# Patient Record
Sex: Female | Born: 1995 | Race: Black or African American | Hispanic: No | Marital: Single | Smoking: Never smoker
Health system: Southern US, Community
[De-identification: ages and names within clinical notes are randomized; demographics above are authoritative.]

---

## 2014-06-03 ENCOUNTER — Emergency Department (HOSPITAL_BASED_OUTPATIENT_CLINIC_OR_DEPARTMENT_OTHER): Payer: Medicaid Other

## 2014-06-03 ENCOUNTER — Emergency Department (HOSPITAL_BASED_OUTPATIENT_CLINIC_OR_DEPARTMENT_OTHER)
Admission: EM | Admit: 2014-06-03 | Discharge: 2014-06-04 | Disposition: A | Discharge status: Home / Self Care | Payer: Medicaid Other | Attending: Emergency Medicine | Admitting: Emergency Medicine

## 2014-06-03 ENCOUNTER — Encounter (HOSPITAL_BASED_OUTPATIENT_CLINIC_OR_DEPARTMENT_OTHER): Payer: Self-pay | Admitting: Emergency Medicine

## 2014-06-03 DIAGNOSIS — B349 Viral infection, unspecified: Secondary | ICD-10-CM

## 2014-06-03 DIAGNOSIS — R079 Chest pain, unspecified: Secondary | ICD-10-CM | POA: Insufficient documentation

## 2014-06-03 DIAGNOSIS — R05 Cough: Secondary | ICD-10-CM | POA: Diagnosis present

## 2014-06-03 NOTE — ED Notes (Signed)
Cough, chills, difficulty taking a deep breath x 2 days. Pain in her left lung. Hx of pneumonia.

## 2014-06-04 NOTE — Discharge Instructions (Signed)
We saw you in the ER for the chest discomfort and cough. We think what you have is a viral syndrome - the treatment for which is symptomatic relief only, and your body will fight the infection off in a few days.  See your primary care doctor in 1 week if the symptoms dont improve.   Viral Infections A viral infection can be caused by different types of viruses.Most viral infections are not serious and resolve on their own. However, some infections may cause severe symptoms and may lead to further complications. SYMPTOMS Viruses can frequently cause:  Minor sore throat.  Aches and pains.  Headaches.  Runny nose.  Different types of rashes.  Watery eyes.  Tiredness.  Cough.  Loss of appetite.  Gastrointestinal infections, resulting in nausea, vomiting, and diarrhea. These symptoms do not respond to antibiotics because the infection is not caused by bacteria. However, you might catch a bacterial infection following the viral infection. This is sometimes called a "superinfection." Symptoms of such a bacterial infection may include:  Worsening sore throat with pus and difficulty swallowing.  Swollen neck glands.  Chills and a high or persistent fever.  Severe headache.  Tenderness over the sinuses.  Persistent overall ill feeling (malaise), muscle aches, and tiredness (fatigue).  Persistent cough.  Yellow, green, or brown mucus production with coughing. HOME CARE INSTRUCTIONS   Only take over-the-counter or prescription medicines for pain, discomfort, diarrhea, or fever as directed by your caregiver.  Drink enough water and fluids to keep your urine clear or pale yellow. Sports drinks can provide valuable electrolytes, sugars, and hydration.  Get plenty of rest and maintain proper nutrition. Soups and broths with crackers or rice are fine. SEEK IMMEDIATE MEDICAL CARE IF:   You have severe headaches, shortness of breath, chest pain, neck pain, or an unusual  rash.  You have uncontrolled vomiting, diarrhea, or you are unable to keep down fluids.  You or your child has an oral temperature above 102 F (38.9 C), not controlled by medicine.  Your baby is older than 3 months with a rectal temperature of 102 F (38.9 C) or higher.  Your baby is 313 months old or younger with a rectal temperature of 100.4 F (38 C) or higher. MAKE SURE YOU:   Understand these instructions.  Will watch your condition.  Will get help right away if you are not doing well or get worse. Document Released: 05/22/2005 Document Revised: 11/04/2011 Document Reviewed: 12/17/2010 [facility] IExitCare Patient Information 2015 [place], [place]. This information is not intended to replace advice given to you by your health care provider. Make sure you discuss any questions you have with your health care provider.
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
## 2014-11-25 ENCOUNTER — Encounter (HOSPITAL_BASED_OUTPATIENT_CLINIC_OR_DEPARTMENT_OTHER): Payer: Self-pay

## 2014-11-25 ENCOUNTER — Emergency Department (HOSPITAL_BASED_OUTPATIENT_CLINIC_OR_DEPARTMENT_OTHER)
Admission: EM | Admit: 2014-11-25 | Discharge: 2014-11-25 | Disposition: A | Discharge status: Home / Self Care | Payer: Medicaid Other | Attending: Emergency Medicine | Admitting: Emergency Medicine

## 2014-11-25 DIAGNOSIS — L509 Urticaria, unspecified: Secondary | ICD-10-CM | POA: Diagnosis not present

## 2014-11-25 DIAGNOSIS — R21 Rash and other nonspecific skin eruption: Secondary | ICD-10-CM | POA: Diagnosis present

## 2014-11-25 DIAGNOSIS — R110 Nausea: Secondary | ICD-10-CM | POA: Diagnosis not present

## 2014-11-25 MED ORDER — PREDNISONE 50 MG PO TABS
60.0000 mg | ORAL_TABLET | Freq: Once | ORAL | Status: AC
  Administered 2014-11-25: 60 mg via ORAL
  Filled 2014-11-25 (×2): qty 1

## 2014-11-25 MED ORDER — PREDNISONE 10 MG PO TABS
10.0000 mg | ORAL_TABLET | Freq: Every day | ORAL | Status: DC
  Filled 2014-11-25: qty 1

## 2014-11-25 MED ORDER — FAMOTIDINE 20 MG PO TABS
20.0000 mg | ORAL_TABLET | Freq: Once | ORAL | Status: AC
  Administered 2014-11-25: 20 mg via ORAL
  Filled 2014-11-25: qty 1

## 2014-11-25 MED ORDER — DIPHENHYDRAMINE HCL 25 MG PO TABS: 25.0000 mg | ORAL_TABLET | Freq: Four times a day (QID) | ORAL | Status: DC

## 2014-11-25 MED ORDER — PREDNISONE 20 MG PO TABS: ORAL_TABLET | ORAL | Status: DC

## 2014-11-25 MED ORDER — FAMOTIDINE 20 MG PO TABS: 20.0000 mg | ORAL_TABLET | Freq: Two times a day (BID) | ORAL | Status: DC

## 2014-11-25 MED ORDER — DIPHENHYDRAMINE HCL 25 MG PO CAPS
25.0000 mg | ORAL_CAPSULE | Freq: Once | ORAL | Status: AC
  Administered 2014-11-25: 25 mg via ORAL

## 2014-11-25 MED ORDER — DIPHENHYDRAMINE HCL 25 MG PO CAPS
25.0000 mg | ORAL_CAPSULE | Freq: Four times a day (QID) | ORAL | Status: DC | PRN
  Filled 2014-11-25: qty 1
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
## 2014-11-25 NOTE — ED Notes (Signed)
Pt states stayed at her grandmothers last night; states has been itching all over since; pt has raised areas to bilateral arms, legs, ankles and abdomen; denies any new or change in lotions or soaps. No distress noted

## 2014-11-25 NOTE — Discharge Instructions (Signed)
Hives Hives are itchy, red, swollen areas of the skin. They can vary in size and location on your body. Hives can come and go for hours or several days (acute hives) or for several weeks (chronic hives). Hives do not spread from person to person (noncontagious). They may get worse with scratching, exercise, and emotional stress. CAUSES   Allergic reaction to food, additives, or drugs.  Infections, including the common cold.  Illness, such as vasculitis, lupus, or thyroid disease.  Exposure to sunlight, heat, or cold.  Exercise.  Stress.  Contact with chemicals. SYMPTOMS   Red or white swollen patches on the skin. The patches may change size, shape, and location quickly and repeatedly.  Itching.  Swelling of the hands, feet, and face. This may occur if hives develop deeper in the skin. DIAGNOSIS  Your caregiver can usually tell what is wrong by performing a physical exam. Skin or blood tests may also be done to determine the cause of your hives. In some cases, the cause cannot be determined. TREATMENT  Mild cases usually get better with medicines such as antihistamines. Severe cases may require an emergency epinephrine injection. If the cause of your hives is known, treatment includes avoiding that trigger.  HOME CARE INSTRUCTIONS   Avoid causes that trigger your hives.  Take antihistamines as directed by your caregiver to reduce the severity of your hives. Non-sedating or low-sedating antihistamines are usually recommended. Do not drive while taking an antihistamine.  Take any other medicines prescribed for itching as directed by your caregiver.  Wear loose-fitting clothing.  Keep all follow-up appointments as directed by your caregiver. SEEK MEDICAL CARE IF:   You have persistent or severe itching that is not relieved with medicine.  You have painful or swollen joints. SEEK IMMEDIATE MEDICAL CARE IF:   You have a fever.  Your tongue or lips are swollen.  You have  trouble breathing or swallowing.  You feel tightness in the throat or chest.  You have abdominal pain. These problems may be the first sign of a life-threatening allergic reaction. Call your local emergency services (911 in [place] MAKE SURE YOU:   Understand these instructions.  Will watch your condition.  Will get help right away if you are not doing well or get worse. Document Released: 08/12/2005 Document Revised: 08/17/2013 Document Reviewed: 11/05/2011 [employer] Patient Information 2015 [employer]. This information is not intended to replace advice given to you by your health care provider. Make sure you discuss any questions you have with your health care provider.  

## 2015-01-05 ENCOUNTER — Emergency Department (HOSPITAL_BASED_OUTPATIENT_CLINIC_OR_DEPARTMENT_OTHER)
Admission: EM | Admit: 2015-01-05 | Discharge: 2015-01-05 | Disposition: A | Discharge status: Home / Self Care | Payer: No Typology Code available for payment source | Attending: Emergency Medicine | Admitting: Emergency Medicine

## 2015-01-05 ENCOUNTER — Encounter (HOSPITAL_BASED_OUTPATIENT_CLINIC_OR_DEPARTMENT_OTHER): Payer: Self-pay | Admitting: *Deleted

## 2015-01-05 ENCOUNTER — Emergency Department (HOSPITAL_BASED_OUTPATIENT_CLINIC_OR_DEPARTMENT_OTHER): Payer: No Typology Code available for payment source

## 2015-01-05 DIAGNOSIS — Y9389 Activity, other specified: Secondary | ICD-10-CM | POA: Insufficient documentation

## 2015-01-05 DIAGNOSIS — S3992XA Unspecified injury of lower back, initial encounter: Secondary | ICD-10-CM | POA: Diagnosis not present

## 2015-01-05 DIAGNOSIS — Y998 Other external cause status: Secondary | ICD-10-CM | POA: Diagnosis not present

## 2015-01-05 DIAGNOSIS — S199XXA Unspecified injury of neck, initial encounter: Secondary | ICD-10-CM | POA: Insufficient documentation

## 2015-01-05 DIAGNOSIS — Y92410 Unspecified street and highway as the place of occurrence of the external cause: Secondary | ICD-10-CM | POA: Insufficient documentation

## 2015-01-05 DIAGNOSIS — S4992XA Unspecified injury of left shoulder and upper arm, initial encounter: Secondary | ICD-10-CM | POA: Insufficient documentation

## 2015-01-05 DIAGNOSIS — M25512 Pain in left shoulder: Secondary | ICD-10-CM
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
## 2015-01-29 ENCOUNTER — Emergency Department (HOSPITAL_BASED_OUTPATIENT_CLINIC_OR_DEPARTMENT_OTHER)
Admission: EM | Admit: 2015-01-29 | Discharge: 2015-01-29 | Disposition: A | Discharge status: Home / Self Care | Payer: Medicaid Other | Attending: Emergency Medicine | Admitting: Emergency Medicine

## 2015-01-29 ENCOUNTER — Encounter (HOSPITAL_BASED_OUTPATIENT_CLINIC_OR_DEPARTMENT_OTHER): Payer: Self-pay | Admitting: *Deleted

## 2015-01-29 DIAGNOSIS — Z793 Long term (current) use of hormonal contraceptives: Secondary | ICD-10-CM | POA: Insufficient documentation

## 2015-01-29 DIAGNOSIS — R350 Frequency of micturition: Secondary | ICD-10-CM | POA: Diagnosis present

## 2015-01-29 DIAGNOSIS — Z3202 Encounter for pregnancy test, result negative: Secondary | ICD-10-CM | POA: Diagnosis not present

## 2015-01-29 DIAGNOSIS — N390 Urinary tract infection, site not specified: Secondary | ICD-10-CM | POA: Insufficient documentation

## 2015-01-29 LAB — URINALYSIS, ROUTINE W REFLEX MICROSCOPIC
BILIRUBIN URINE: NEGATIVE
GLUCOSE, UA: NEGATIVE mg/dL
Ketones, ur: NEGATIVE mg/dL
NITRITE: NEGATIVE
Protein, ur: 100 mg/dL — AB
SPECIFIC GRAVITY, URINE: 1.023 (ref 1.005–1.030)
Urobilinogen, UA: 1 mg/dL (ref 0.0–1.0)
pH: 6 (ref 5.0–8.0)

## 2015-01-29 LAB — PREGNANCY, URINE: Preg Test, Ur: NEGATIVE

## 2015-01-29 LAB — URINE MICROSCOPIC-ADD ON

## 2015-01-29 MED ORDER — CEPHALEXIN 250 MG PO CAPS
500.0000 mg | ORAL_CAPSULE | Freq: Once | ORAL | Status: AC
  Administered 2015-01-29: 500 mg via ORAL
  Filled 2015-01-29: qty 2

## 2015-01-29 MED ORDER — CEPHALEXIN 500 MG PO CAPS: 500.0000 mg | ORAL_CAPSULE | Freq: Two times a day (BID) | ORAL | Status: DC

## 2015-01-29 NOTE — ED Provider Notes (Signed)
CSN: [id number]     Arrival date & time 01/29/15  1326 History   First MD Initiated Contact with Patient 01/29/15 1328     Chief Complaint  Patient presents with  . Urinary Frequency     (Consider location/radiation/quality/duration/timing/severity/associated sxs/prior Treatment) HPI   Katie Wheeler is a 19 y.o. female complaining of urinary frequency with lower abdominal pressure and nausea x1 day. Patient states that she is not having any abnormal vaginal discharge, states that she's spotting but that's typical for her Seasonique birth control pills. She denies flank pain, fever, vomiting, hematuria.  History reviewed. No pertinent past medical history. History reviewed. No pertinent past surgical history. History reviewed. No pertinent family history. History  Substance Use Topics  . Smoking status: Never Smoker   . Smokeless tobacco: Not on file  . Alcohol Use: No   OB History    No data available     Review of Systems  10 systems reviewed and found to be negative, except as noted in the HPI.   Allergies  Review of patient's allergies indicates no known allergies.  Home Medications   Prior to Admission medications   Medication Sig Start Date End Date Taking? Authorizing Provider  levonorgestrel-ethinyl estradiol (SEASONALE,INTROVALE,JOLESSA) 0.15-0.03 MG tablet Take 1 tablet by mouth daily.   Yes Historical Provider, MD  cephALEXin (KEFLEX) 500 MG capsule Take 1 capsule (500 mg total) by mouth 2 (two) times daily. 01/29/15   Madine Sarr, PA-C   BP 125/77 mmHg  Pulse 87  Temp(Src) 98.7 F (37.1 C)  Resp 16  SpO2 100%  LMP 01/28/2015 Physical Exam  Constitutional: She is oriented to person, place, and time. She appears well-developed and well-nourished. No distress.  HENT:  Head: Normocephalic and atraumatic.  Mouth/Throat: Oropharynx is clear and moist.  Eyes: Conjunctivae and EOM are normal. Pupils are equal, round, and reactive to light.  Neck: Normal  range of motion.  Cardiovascular: Normal rate, regular rhythm and intact distal pulses.   Pulmonary/Chest: Effort normal and breath sounds normal. No stridor.  Abdominal: Soft. There is no tenderness.  Genitourinary:  No CVAT b/l  Musculoskeletal: Normal range of motion.  Neurological: She is alert and oriented to person, place, and time.  Skin: She is not diaphoretic.  Psychiatric: She has a normal mood and affect.  Nursing note and vitals reviewed.   ED Course  Procedures (including critical care time) Labs Review Labs Reviewed  URINALYSIS, ROUTINE W REFLEX MICROSCOPIC (NOT AT [facility]) - Abnormal; Notable for the following:    Color, Urine AMBER (*)    APPearance TURBID (*)    Hgb urine dipstick LARGE (*)    Protein, ur 100 (*)    Leukocytes, UA LARGE (*)    All other components within normal limits  URINE MICROSCOPIC-ADD ON - Abnormal; Notable for the following:    Bacteria, UA MANY (*)    All other components within normal limits  URINE CULTURE  PREGNANCY, URINE    Imaging Review No results found.   EKG Interpretation None      MDM   Final diagnoses:  UTI (lower urinary tract infection)    Filed Vitals:   01/29/15 1336  BP: 125/77  Pulse: 87  Temp: 98.7 F (37.1 C)  Resp: 16  SpO2: 100%    Katie Wheeler is a pleasant 19 y.o. female presenting with urinary frequency and lower abdominal pressure associated with nausea worsening over the course of the day. Patient is afebrile, well-appearing, abdominal exam is benign.  Patient reports no abnormal vaginal discharge. Will check UA/urine pregnancy.  UA consistent with infection. Started empirically on flex. Urine cultures ordered. Return precautions for pyelonephritis discussed.  Evaluation does not show pathology that would require ongoing emergent intervention or inpatient treatment. Pt is hemodynamically stable and mentating appropriately. Discussed findings and plan with patient/guardian, who agrees with care  plan. All questions answered. Return precautions discussed and outpatient follow up given.   New Prescriptions   CEPHALEXIN (KEFLEX) 500 MG CAPSULE    Take 1 capsule (500 mg total) by mouth 2 (two) times daily.         Wynetta Emeryicole Julizza Sassone, PA-C 01/29/15 1421  Elwin MochaBlair Walden, MD 01/29/15 [phone]

## 2015-01-29 NOTE — Discharge Instructions (Signed)
°Take your antibiotics as directed and to completion. You should never have any leftover antibiotics! Push fluids and stay well hydrated.  ° °Any antibiotic use can reduce the efficacy of hormonal birth control. Please use back up method of contraception.  ° °Please follow with your primary care doctor in the next 2 days for a check-up. They must obtain records for further management.  ° °Do not hesitate to return to the Emergency Department for any new, worsening or concerning symptoms.  ° °. °Urinary Tract Infection °Urinary tract infections (UTIs) can develop anywhere along your urinary tract. Your urinary tract is your body's drainage system for removing wastes and extra water. Your urinary tract includes two kidneys, two ureters, a bladder, and a urethra. Your kidneys are a pair of bean-shaped organs. Each kidney is about the size of your fist. They are located below your ribs, one on each side of your spine. °CAUSES °Infections are caused by microbes, which are microscopic organisms, including fungi, viruses, and bacteria. These organisms are so small that they can only be seen through a microscope. Bacteria are the microbes that most commonly cause UTIs. °SYMPTOMS  °Symptoms of UTIs may vary by age and gender of the patient and by the location of the infection. Symptoms in young women typically include a frequent and intense urge to urinate and a painful, burning feeling in the bladder or urethra during urination. Older women and men are more likely to be tired, shaky, and weak and have muscle aches and abdominal pain. A fever may mean the infection is in your kidneys. Other symptoms of a kidney infection include pain in your back or sides below the ribs, nausea, and vomiting. °DIAGNOSIS °To diagnose a UTI, your caregiver will ask you about your symptoms. Your caregiver also will ask to provide a urine sample. The urine sample will be tested for bacteria and white blood cells. White blood cells are made by  your body to help fight infection. °TREATMENT  °Typically, UTIs can be treated with medication. Because most UTIs are caused by a bacterial infection, they usually can be treated with the use of antibiotics. The choice of antibiotic and length of treatment depend on your symptoms and the type of bacteria causing your infection. °HOME CARE INSTRUCTIONS °· If you were prescribed antibiotics, take them exactly as your caregiver instructs you. Finish the medication even if you feel better after you have only taken some of the medication. °· Drink enough water and fluids to keep your urine clear or pale yellow. °· Avoid caffeine, tea, and carbonated beverages. They tend to irritate your bladder. °· Empty your bladder often. Avoid holding urine for long periods of time. °· Empty your bladder before and after sexual intercourse. °· After a bowel movement, women should cleanse from front to back. Use each tissue only once. °SEEK MEDICAL CARE IF:  °· You have back pain. °· You develop a fever. °· Your symptoms do not begin to resolve within 3 days. °SEEK IMMEDIATE MEDICAL CARE IF:  °· You have severe back pain or lower abdominal pain. °· You develop chills. °· You have nausea or vomiting. °· You have continued burning or discomfort with urination. °MAKE SURE YOU:  °· Understand these instructions. °· Will watch your condition. °· Will get help right away if you are not doing well or get worse. °Document Released: 05/22/2005 Document Revised: 02/11/2012 Document Reviewed: 09/20/2011 °ExitCare® Patient Information ©2015 [employer]. This information is not intended to replace advice given to   you by your health care provider. Make sure you discuss any questions you have with your health care provider. ° °

## 2015-01-29 NOTE — ED Notes (Signed)
Pt c/o urinary freq with some  Pressure x 1 day, nausea " for weeks"

## 2015-01-31 LAB — URINE CULTURE

## 2015-02-01 ENCOUNTER — Telehealth (HOSPITAL_BASED_OUTPATIENT_CLINIC_OR_DEPARTMENT_OTHER): Payer: Self-pay | Admitting: Emergency Medicine

## 2015-02-01 NOTE — Telephone Encounter (Signed)
Post ED Visit - Positive Culture Follow-up  Culture report reviewed by antimicrobial stewardship pharmacist: []  Wes Dulaney, Pharm.D., BCPS []  Celedonio MiyamotoJeremy Frens, Pharm.D., BCPS [x]  Georgina PillionElizabeth Martin, Pharm.D., BCPS []  MulberryMinh Pham, [address]Ph[address]D., BCPS, AAHIVP []  Estella HuskMichelle Turner, Pharm.D., BCPS, AAHIVP []  Elder CyphersLorie Poole, [address]Pharm[address]D., BCPS  Positive urine  Culture Klebsiella Treated with cephalexin, organism sensitive to the same and no further patient follow-up is required at this time.  Berle MullMiller, Alisea Matte 02/01/2015, 9:27 AM

## 2015-04-02 ENCOUNTER — Encounter (HOSPITAL_BASED_OUTPATIENT_CLINIC_OR_DEPARTMENT_OTHER): Payer: Self-pay

## 2015-04-02 ENCOUNTER — Emergency Department (HOSPITAL_BASED_OUTPATIENT_CLINIC_OR_DEPARTMENT_OTHER)
Admission: EM | Admit: 2015-04-02 | Discharge: 2015-04-02 | Disposition: A | Discharge status: Home / Self Care | Payer: Medicaid Other | Attending: Emergency Medicine | Admitting: Emergency Medicine

## 2015-04-02 DIAGNOSIS — R05 Cough: Secondary | ICD-10-CM | POA: Diagnosis not present

## 2015-04-02 DIAGNOSIS — Z79899 Other long term (current) drug therapy: Secondary | ICD-10-CM | POA: Diagnosis not present

## 2015-04-02 DIAGNOSIS — R059 Cough, unspecified: Secondary | ICD-10-CM

## 2015-04-02 DIAGNOSIS — Z792 Long term (current) use of antibiotics: Secondary | ICD-10-CM | POA: Diagnosis not present

## 2015-04-02 DIAGNOSIS — R079 Chest pain, unspecified: Secondary | ICD-10-CM | POA: Insufficient documentation

## 2015-04-02 NOTE — Discharge Instructions (Signed)
Over the counter decongestants and cough medication as needed.  Return to the ER if symptoms significantly worsen or change.   Cough, Adult  A cough is a reflex that helps clear your throat and airways. It can help heal the body or may be a reaction to an irritated airway. A cough may only last 2 or 3 weeks (acute) or may last more than 8 weeks (chronic).  CAUSES Acute cough:  Viral or bacterial infections. Chronic cough:  Infections.  Allergies.  Asthma.  Post-nasal drip.  Smoking.  Heartburn or acid reflux.  Some medicines.  Chronic lung problems (COPD).  Cancer. SYMPTOMS   Cough.  Fever.  Chest pain.  Increased breathing rate.  High-pitched whistling sound when breathing (wheezing).  Colored mucus that you cough up (sputum). TREATMENT   A bacterial cough may be treated with antibiotic medicine.  A viral cough must run its course and will not respond to antibiotics.  Your caregiver may recommend other treatments if you have a chronic cough. HOME CARE INSTRUCTIONS   Only take over-the-counter or prescription medicines for pain, discomfort, or fever as directed by your caregiver. Use cough suppressants only as directed by your caregiver.  Use a cold steam vaporizer or humidifier in your bedroom or home to help loosen secretions.  Sleep in a semi-upright position if your cough is worse at night.  Rest as needed.  Stop smoking if you smoke. SEEK IMMEDIATE MEDICAL CARE IF:   You have pus in your sputum.  Your cough starts to worsen.  You cannot control your cough with suppressants and are losing sleep.  You begin coughing up blood.  You have difficulty breathing.  You develop pain which is getting worse or is uncontrolled with medicine.  You have a fever. MAKE SURE YOU:   Understand these instructions.  Will watch your condition.  Will get help right away if you are not doing well or get worse. Document Released: 02/08/2011 Document  Revised: 11/04/2011 Document Reviewed: 02/08/2011 [facility] Patient Information 2015 [place], [place]. This information is not intended to replace advice given to you by your health care provider. Make sure you discuss any questions you have with your health care provider.

## 2015-04-02 NOTE — ED Notes (Signed)
PT reports cough x2 days - states productive with yellow sputum - denies fever.

## 2015-04-02 NOTE — ED Provider Notes (Signed)
CSN: [id number]     Arrival date & time 04/02/15  0155 History   First MD Initiated Contact with Patient 04/02/15 0210     Chief Complaint  Patient presents with  . Cough     (Consider location/radiation/quality/duration/timing/severity/associated sxs/prior Treatment) HPI Comments: Patient is a 19 year old female with no significant past medical history. She started yesterday with chest congestion and cough. This evening she woke from sleep with chest congestion and coughed up a small amount of green sputum. She denies any chest pain, fevers, shortness of breath.  Patient is a 19 y.o. female presenting with cough. The history is provided by the patient.  Cough Cough characteristics:  Productive Sputum characteristics:  Green Severity:  Mild Onset quality:  Sudden Duration:  24 hours Timing:  Constant Progression:  Unchanged Chronicity:  New Relieved by:  Nothing Worsened by:  Nothing tried   History reviewed. No pertinent past medical history. History reviewed. No pertinent past surgical history. History reviewed. No pertinent family history. History  Substance Use Topics  . Smoking status: Never Smoker   . Smokeless tobacco: Not on file  . Alcohol Use: No   OB History    No data available     Review of Systems  Respiratory: Positive for cough.   All other systems reviewed and are negative.     Allergies  Review of patient's allergies indicates no known allergies.  Home Medications   Prior to Admission medications   Medication Sig Start Date End Date Taking? Authorizing Provider  cephALEXin (KEFLEX) 500 MG capsule Take 1 capsule (500 mg total) by mouth 2 (two) times daily. 01/29/15   Nicole Pisciotta, PA-C  levonorgestrel-ethinyl estradiol (SEASONALE,INTROVALE,JOLESSA) 0.15-0.03 MG tablet Take 1 tablet by mouth daily.    Historical Provider, MD   BP 142/78 mmHg  Pulse 88  Temp(Src) 99 F (37.2 C) (Oral)  SpO2 100%  LMP 02/27/2015 Physical Exam   Constitutional: She is oriented to person, place, and time. She appears well-developed and well-nourished. No distress.  HENT:  Head: Normocephalic and atraumatic.  Neck: Normal range of motion. Neck supple.  Cardiovascular: Normal rate and regular rhythm.  Exam reveals no gallop and no friction rub.   No murmur heard. Pulmonary/Chest: Effort normal and breath sounds normal. No respiratory distress. She has no wheezes.  Abdominal: Soft. Bowel sounds are normal. She exhibits no distension. There is no tenderness.  Musculoskeletal: Normal range of motion.  Neurological: She is alert and oriented to person, place, and time.  Skin: Skin is warm and dry. She is not diaphoretic.  Nursing note and vitals reviewed.   ED Course  Procedures (including critical care time) Labs Review Labs Reviewed - No data to display  Imaging Review No results found.   EKG Interpretation None      MDM   Final diagnoses:  None    Lungs are clear and oxygen saturations are 100%. I highly suspect a viral etiology and do not feel as though any imaging or intervention is necessary. Will recommend over-the-counter medications and when necessary return.    Geoffery Lyons, MD 04/02/15 [phone]

## 2015-08-01 ENCOUNTER — Emergency Department (HOSPITAL_BASED_OUTPATIENT_CLINIC_OR_DEPARTMENT_OTHER)
Admission: EM | Admit: 2015-08-01 | Discharge: 2015-08-01 | Disposition: A | Discharge status: Home / Self Care | Payer: Medicaid Other | Attending: Emergency Medicine | Admitting: Emergency Medicine

## 2015-08-01 ENCOUNTER — Encounter (HOSPITAL_BASED_OUTPATIENT_CLINIC_OR_DEPARTMENT_OTHER): Payer: Self-pay | Admitting: *Deleted

## 2015-08-01 DIAGNOSIS — Z792 Long term (current) use of antibiotics: Secondary | ICD-10-CM | POA: Diagnosis not present

## 2015-08-01 DIAGNOSIS — Z793 Long term (current) use of hormonal contraceptives: Secondary | ICD-10-CM | POA: Insufficient documentation

## 2015-08-01 DIAGNOSIS — N764 Abscess of vulva: Secondary | ICD-10-CM | POA: Insufficient documentation

## 2015-08-01 DIAGNOSIS — L0291 Cutaneous abscess, unspecified: Secondary | ICD-10-CM

## 2015-08-01 MED ORDER — HYDROCODONE-ACETAMINOPHEN 5-325 MG PO TABS
2.0000 | ORAL_TABLET | Freq: Once | ORAL | Status: AC
  Administered 2015-08-01: 2 via ORAL
  Filled 2015-08-01: qty 2

## 2015-08-01 MED ORDER — LIDOCAINE-EPINEPHRINE (PF) 2 %-1:200000 IJ SOLN
INTRAMUSCULAR | Status: AC
  Filled 2015-08-01: qty 10

## 2015-08-01 MED ORDER — LIDOCAINE-EPINEPHRINE 2 %-1:100000 IJ SOLN
20.0000 mL | Freq: Once | INTRAMUSCULAR | Status: AC
  Administered 2015-08-01: 20 mL
  Filled 2015-08-01: qty 1

## 2015-08-01 MED ORDER — CEPHALEXIN 500 MG PO CAPS: 500.0000 mg | ORAL_CAPSULE | Freq: Three times a day (TID) | ORAL | Status: DC

## 2015-08-01 MED ORDER — HYDROCODONE-ACETAMINOPHEN 5-325 MG PO TABS: 1.0000 | ORAL_TABLET | Freq: Four times a day (QID) | ORAL | Status: DC | PRN

## 2015-08-01 NOTE — ED Notes (Signed)
She has an abscess on her vagina.

## 2015-08-01 NOTE — ED Notes (Signed)
MD at bedside. 

## 2015-08-01 NOTE — ED Notes (Signed)
MD at bedside for I&D of labial abscess

## 2015-08-01 NOTE — Discharge Instructions (Signed)
Abscess Sit in warm bathtub 4 times daily for 30 minutes of time. Take the pain medication prescribed for bad pain or Advil for mild pain. Call your gynecologist tomorrow to be reexamined in 2 or 3 days. If he cannot get in to see her gynecologist, return here. An abscess is an infected area that contains a collection of pus and debris.It can occur in almost any part of the body. An abscess is also known as a furuncle or boil. CAUSES  An abscess occurs when tissue gets infected. This can occur from blockage of oil or sweat glands, infection of hair follicles, or a minor injury to the skin. As the body tries to fight the infection, pus collects in the area and creates pressure under the skin. This pressure causes pain. People with weakened immune systems have difficulty fighting infections and get certain abscesses more often.  SYMPTOMS Usually an abscess develops on the skin and becomes a painful mass that is red, warm, and tender. If the abscess forms under the skin, you may feel a moveable soft area under the skin. Some abscesses break open (rupture) on their own, but most will continue to get worse without care. The infection can spread deeper into the body and eventually into the bloodstream, causing you to feel ill.  DIAGNOSIS  Your caregiver will take your medical history and perform a physical exam. A sample of fluid may also be taken from the abscess to determine what is causing your infection. TREATMENT  Your caregiver may prescribe antibiotic medicines to fight the infection. However, taking antibiotics alone usually does not cure an abscess. Your caregiver may need to make a small cut (incision) in the abscess to drain the pus. In some cases, gauze is packed into the abscess to reduce pain and to continue draining the area. HOME CARE INSTRUCTIONS   Only take over-the-counter or prescription medicines for pain, discomfort, or fever as directed by your caregiver.  If you were prescribed  antibiotics, take them as directed. Finish them even if you start to feel better.  If gauze is used, follow your caregiver's directions for changing the gauze.  To avoid spreading the infection:  Keep your draining abscess covered with a bandage.  Wash your hands well.  Do not share personal care items, towels, or whirlpools with others.  Avoid skin contact with others.  Keep your skin and clothes clean around the abscess.  Keep all follow-up appointments as directed by your caregiver. SEEK MEDICAL CARE IF:   You have increased pain, swelling, redness, fluid drainage, or bleeding.  You have muscle aches, chills, or a general ill feeling.  You have a fever. MAKE SURE YOU:   Understand these instructions.  Will watch your condition.  Will get help right away if you are not doing well or get worse.   This information is not intended to replace advice given to you by your health care provider. Make sure you discuss any questions you have with your health care provider.   Document Released: 05/22/2005 Document Revised: 02/11/2012 Document Reviewed: 10/25/2011 Elsevier Interactive Patient Education [employer]2016 [employer]

## 2015-08-01 NOTE — ED Provider Notes (Signed)
CSN: [id number]     Arrival date & time 08/01/15  1947 History  By signing my name below, I, Soijett Blue, attest that this documentation has been prepared under the direction and in the presence of Doug Sou, MD. Electronically Signed: Soijett Blue, ED Scribe. 08/01/2015. 9:56 PM.   Chief Complaint  Patient presents with  . Abscess      The history is provided by the patient. No language interpreter was used.    My Rinke is a 19 y.o. female who presents to the Emergency Department complaining of abscess to vaginal area onset 3 days ago. She notes that she thought that the area was initially a hair bump and that she popped the area. She reports that since popping that bump that the area has become larger in size and is more tender. Pt is not having associated symptoms of redness, drainage denies vaginal discharge denies fever. She notes that she has tried warm compresses/soaks,  without medications for the relief of her symptoms. She denies fever, chills, drainage,  and any other symptoms. Denies allergies to medications. Denies smoking cigarettes, illegal drug use, or alcohol use. Patient's last menstrual period was 07/23/2015. Pain is worse with sitting, improved with other positions   History reviewed. No pertinent past medical history. aspirate history negative  History reviewed. No pertinent past surgical history. No family history on file. Social History  Substance Use Topics  . Smoking status: Never Smoker   . Smokeless tobacco: None  . Alcohol Use: No   OB History    No data available     Review of Systems  Constitutional: Negative.   HENT: Negative.   Respiratory: Negative.   Cardiovascular: Negative.   Gastrointestinal: Negative.   Musculoskeletal: Negative.   Skin: Positive for wound.       Swollen painful area on suprapubic area  Neurological: Negative.   Psychiatric/Behavioral: Negative.       Allergies  Review of patient's allergies indicates no  known allergies.  Home Medications   Prior to Admission medications   Medication Sig Start Date End Date Taking? Authorizing Provider  cephALEXin (KEFLEX) 500 MG capsule Take 1 capsule (500 mg total) by mouth 2 (two) times daily. 01/29/15   Nicole Pisciotta, PA-C  levonorgestrel-ethinyl estradiol (SEASONALE,INTROVALE,JOLESSA) 0.15-0.03 MG tablet Take 1 tablet by mouth daily.    Historical Provider, MD   BP 137/82 mmHg  Pulse 90  Temp(Src) 99 F (37.2 C) (Oral)  Resp 18  Ht  (1.651 m)  Wt 145 lb (65.772 kg)  BMI 24.13 kg/m2  SpO2 100%  LMP 07/25/2015 Physical Exam  Constitutional: She appears well-developed and well-nourished.  HENT:  Head: Normocephalic and atraumatic.  Eyes: Conjunctivae are normal. Pupils are equal, round, and reactive to light.  Neck: Neck supple. No tracheal deviation present. No thyromegaly present.  Cardiovascular: Normal rate.   Pulmonary/Chest: Effort normal.  Abdominal: Soft. Bowel sounds are normal. She exhibits no distension. There is no tenderness.  Genitourinary:  Fluctuant area spontaneously draining on left labia majora. Labia minora are normal.  Musculoskeletal: Normal range of motion. She exhibits no edema or tenderness.  Neurological: She is alert. Coordination normal.  Skin: Skin is warm and dry. No rash noted.  Psychiatric: She has a normal mood and affect.  Nursing note and vitals reviewed.   ED Course  Procedures (including critical care time) DIAGNOSTIC STUDIES: Oxygen Saturation is 100% on RA, nl by my interpretation.    COORDINATION OF CARE: 9:56 PM Discussed treatment plan  with pt at bedside which includes I&D and pt agreed to plan.    Labs Review Labs Reviewed - No data to display  Imaging Review No results found.   EKG Interpretation None     INCISION AND DRAINAGE Performed by: Doug SouJACUBOWITZ,Evella Kasal Consent: Verbal consent obtained. Risks and benefits: risks, benefits and alternatives were discussed Type:  abscess  Body area: left labia majora  Anesthesia: local infiltration  Incision was made with a scalpel.  Local anesthetic: lidocaine 2 withnephrine  Anesthetic total: 4ml  Complexity: complex Blunt dissection to break up loculations  Drainage: purulent  Drainage amount: copious   Patient tolerance: Patient tolerated the procedure well with no immediate complications.   MDM  Was not a Bartholin's abscess  Plan prescription Norco, Keflex Warm soaks. Return or see gynecologist 2 days for recheck  Final diagnoses:  None   Dxbscess of left labia majora   I personally performed the services described in this documentation, which was scribed in my presence. The recorded information has been reviewed and considered.   Doug SouSam Tecumseh Yeagley, MD 08/01/15 2221

## 2015-10-11 ENCOUNTER — Emergency Department (HOSPITAL_BASED_OUTPATIENT_CLINIC_OR_DEPARTMENT_OTHER)
Admission: EM | Admit: 2015-10-11 | Discharge: 2015-10-11 | Disposition: A | Discharge status: Home / Self Care | Payer: No Typology Code available for payment source | Attending: Emergency Medicine | Admitting: Emergency Medicine

## 2015-10-11 ENCOUNTER — Encounter (HOSPITAL_BASED_OUTPATIENT_CLINIC_OR_DEPARTMENT_OTHER): Payer: Self-pay

## 2015-10-11 DIAGNOSIS — N760 Acute vaginitis: Secondary | ICD-10-CM | POA: Insufficient documentation

## 2015-10-11 DIAGNOSIS — R110 Nausea: Secondary | ICD-10-CM | POA: Insufficient documentation

## 2015-10-11 DIAGNOSIS — Z3202 Encounter for pregnancy test, result negative: Secondary | ICD-10-CM | POA: Insufficient documentation

## 2015-10-11 DIAGNOSIS — B9689 Other specified bacterial agents as the cause of diseases classified elsewhere: Secondary | ICD-10-CM

## 2015-10-11 LAB — COMPREHENSIVE METABOLIC PANEL
ALK PHOS: 43 U/L (ref 38–126)
ALT: 12 U/L — ABNORMAL LOW (ref 14–54)
ANION GAP: 7 (ref 5–15)
AST: 17 U/L (ref 15–41)
Albumin: 3.8 g/dL (ref 3.5–5.0)
BILIRUBIN TOTAL: 0.6 mg/dL (ref 0.3–1.2)
BUN: 8 mg/dL (ref 6–20)
CALCIUM: 9.1 mg/dL (ref 8.9–10.3)
CO2: 29 mmol/L (ref 22–32)
Chloride: 105 mmol/L (ref 101–111)
Creatinine, Ser: 0.64 mg/dL (ref 0.44–1.00)
Glucose, Bld: 94 mg/dL (ref 65–99)
POTASSIUM: 3.6 mmol/L (ref 3.5–5.1)
Sodium: 141 mmol/L (ref 135–145)
TOTAL PROTEIN: 7.3 g/dL (ref 6.5–8.1)

## 2015-10-11 LAB — URINE MICROSCOPIC-ADD ON

## 2015-10-11 LAB — CBC WITH DIFFERENTIAL/PLATELET
BASOS ABS: 0 10*3/uL (ref 0.0–0.1)
Basophils Relative: 0 %
Eosinophils Absolute: 0.2 10*3/uL (ref 0.0–0.7)
Eosinophils Relative: 2 %
HCT: 37.7 % (ref 36.0–46.0)
Hemoglobin: 11.7 g/dL — ABNORMAL LOW (ref 12.0–15.0)
LYMPHS PCT: 56 %
Lymphs Abs: 3.5 10*3/uL (ref 0.7–4.0)
MCH: 25.3 pg — AB (ref 26.0–34.0)
MCHC: 31 g/dL (ref 30.0–36.0)
MCV: 81.6 fL (ref 78.0–100.0)
Monocytes Absolute: 0.5 10*3/uL (ref 0.1–1.0)
Monocytes Relative: 8 %
NEUTROS ABS: 2.1 10*3/uL (ref 1.7–7.7)
Neutrophils Relative %: 34 %
PLATELETS: 281 10*3/uL (ref 150–400)
RBC: 4.62 MIL/uL (ref 3.87–5.11)
RDW: 12.9 % (ref 11.5–15.5)
WBC: 6.3 10*3/uL (ref 4.0–10.5)

## 2015-10-11 LAB — WET PREP, GENITAL
SPERM: NONE SEEN
TRICH WET PREP: NONE SEEN
YEAST WET PREP: NONE SEEN

## 2015-10-11 LAB — URINALYSIS, ROUTINE W REFLEX MICROSCOPIC
BILIRUBIN URINE: NEGATIVE
GLUCOSE, UA: NEGATIVE mg/dL
Hgb urine dipstick: NEGATIVE
KETONES UR: NEGATIVE mg/dL
Nitrite: NEGATIVE
PH: 7.5 (ref 5.0–8.0)
PROTEIN: NEGATIVE mg/dL
Specific Gravity, Urine: 1.015 (ref 1.005–1.030)

## 2015-10-11 LAB — LIPASE, BLOOD: Lipase: 25 U/L (ref 11–51)

## 2015-10-11 LAB — PREGNANCY, URINE: Preg Test, Ur: NEGATIVE

## 2015-10-11 MED ORDER — METRONIDAZOLE 500 MG PO TABS: 500.0000 mg | ORAL_TABLET | Freq: Two times a day (BID) | ORAL | Status: DC

## 2015-10-11 MED ORDER — ONDANSETRON HCL 4 MG/2ML IJ SOLN
4.0000 mg | Freq: Once | INTRAMUSCULAR | Status: AC
  Administered 2015-10-11: 4 mg via INTRAVENOUS
  Filled 2015-10-11: qty 2

## 2015-10-11 MED ORDER — ONDANSETRON 4 MG PO TBDP: 4.0000 mg | ORAL_TABLET | Freq: Once | ORAL | Status: DC | PRN

## 2015-10-11 MED ORDER — ONDANSETRON 4 MG PO TBDP: 4.0000 mg | ORAL_TABLET | Freq: Three times a day (TID) | ORAL | Status: DC | PRN

## 2015-10-11 NOTE — ED Notes (Signed)
C/o nausea, vaginal d/c x 2 days

## 2015-10-11 NOTE — ED Provider Notes (Signed)
CSN: [id number]     Arrival date & time 10/11/15  1542 History   First MD Initiated Contact with Patient 10/11/15 1601     Chief Complaint  Patient presents with  . Nausea     (Consider location/radiation/quality/duration/timing/severity/associated sxs/prior Treatment) HPI   Pt is a 20 year old female with no past medical history presents the ED with complaint of nausea, onset 2 days. Patient reports having waxing and waning nausea, denies any guarding or alleviating factors. She also reports having intermittent lower abdominal cramping that started 2 days ago. Patient denies taking any medications prior to arrival. Patient notes having small amount of vaginal discharge which she describes as a white mucus consistency. She notes the discharge was a darker brown color yesterday. Denies fever, chills, headache, cough, shortness of breath, chest pain, vomiting, diarrhea, urinary symptoms, blood in urine or stool, vaginal bleeding. Patient endorses recent sick contact, she reports one of her coworkers recently had a stomach bug. Patient reports being sexually active with one partner but denies using any form of protection. Patient is not currently on any form of birth control. LMP 09/29/15.  [id number]. Denies history of abdominal surgeries.  History reviewed. No pertinent past medical history. History reviewed. No pertinent past surgical history. No family history on file. Social History  Substance Use Topics  . Smoking status: Never Smoker   . Smokeless tobacco: None  . Alcohol Use: No   OB History    Gravida Para Term Preterm AB TAB SAB Ectopic Multiple Living   1 1             Review of Systems  Gastrointestinal: Positive for nausea and abdominal pain.  Genitourinary: Positive for vaginal discharge and pelvic pain (cramping).  All other systems reviewed and are negative.     Allergies  Review of patient's allergies indicates no known allergies.  Home Medications   Prior to  Admission medications   Medication Sig Start Date End Date Taking? Authorizing Provider  metroNIDAZOLE (FLAGYL) 500 MG tablet Take 1 tablet (500 mg total) by mouth 2 (two) times daily. 10/11/15   Barrett Henle, PA-C  ondansetron (ZOFRAN ODT) 4 MG disintegrating tablet Take 1 tablet (4 mg total) by mouth every 8 (eight) hours as needed for nausea or vomiting. 10/11/15   Barrett Henle, PA-C   BP 114/74 mmHg  Pulse 72  Temp(Src) 98.4 F (36.9 C) (Oral)  Resp 16  Ht  (1.651 m)  Wt 63.504 kg  BMI 23.30 kg/m2  SpO2 100%  LMP 09/28/2015 Physical Exam  Constitutional: She is oriented to person, place, and time. She appears well-developed and well-nourished. No distress.  HENT:  Head: Normocephalic and atraumatic.  Mouth/Throat: Oropharynx is clear and moist. No oropharyngeal exudate.  Eyes: Conjunctivae and EOM are normal. Pupils are equal, round, and reactive to light. Right eye exhibits no discharge. Left eye exhibits no discharge. No scleral icterus.  Neck: Normal range of motion. Neck supple.  Cardiovascular: Normal rate, regular rhythm, normal heart sounds and intact distal pulses.   Pulmonary/Chest: Effort normal and breath sounds normal. No respiratory distress. She has no wheezes. She has no rales. She exhibits no tenderness.  Abdominal: Soft. Bowel sounds are normal. She exhibits no distension and no mass. There is no tenderness. There is no rebound and no guarding.  Musculoskeletal: Normal range of motion. She exhibits no edema.  Lymphadenopathy:    She has no cervical adenopathy.  Neurological: She is alert and oriented to person, place, and  time.  Skin: Skin is warm and dry. She is not diaphoretic.  Nursing note and vitals reviewed.  Pelvic exam: normal external genitalia, vulva, vagina, cervix, uterus and adnexa, VULVA: normal appearing vulva with no masses, tenderness or lesions, VAGINA: normal appearing vagina with normal color and discharge, no lesions,  vaginal discharge - clear, white and mucoid, WET MOUNT done - results: clue cells, white blood cells, DNA probe for chlamydia and GC obtained, CERVIX: normal appearing cervix without discharge or lesions, UTERUS: uterus is normal size, shape, consistency and nontender, ADNEXA: normal adnexa in size, nontender and no masses, exam chaperoned by female tech.   ED Course  Procedures (including critical care time) Labs Review Labs Reviewed  WET PREP, GENITAL - Abnormal; Notable for the following:    Clue Cells Wet Prep HPF POC PRESENT (*)    WBC, Wet Prep HPF POC MANY (*)    All other components within normal limits  URINALYSIS, ROUTINE W REFLEX MICROSCOPIC (NOT AT [facility]) - Abnormal; Notable for the following:    Leukocytes, UA TRACE (*)    All other components within normal limits  COMPREHENSIVE METABOLIC PANEL - Abnormal; Notable for the following:    ALT 12 (*)    All other components within normal limits  CBC WITH DIFFERENTIAL/PLATELET - Abnormal; Notable for the following:    Hemoglobin 11.7 (*)    MCH 25.3 (*)    All other components within normal limits  URINE MICROSCOPIC-ADD ON - Abnormal; Notable for the following:    Squamous Epithelial / LPF 0-5 (*)    Bacteria, UA RARE (*)    All other components within normal limits  PREGNANCY, URINE  LIPASE, BLOOD  RPR  HIV ANTIBODY (ROUTINE TESTING)  GC/CHLAMYDIA PROBE AMP ([facility]) NOT AT [facility]    Imaging Review No results found. I have personally reviewed and evaluated these images and lab results as part of my medical decision-making.  Filed Vitals:   10/11/15 1548 10/11/15 1830  BP: 119/71 114/74  Pulse: 95 72  Temp: 98.4 F (36.9 C)   Resp: 18 16     MDM   Final diagnoses:  BV (bacterial vaginosis)  Nausea    Patient presents with nausea, vaginal discharge and pelvic cramping. VSS. Exam unremarkable. Pelvic exam revealed small amount of clear white mucoid discharge in vaginal vault. Pregnancy negative. Hgb 11.7.  Labs and UA unremarkable. Wet prep positive for clue cells and WBCs. Labs obtained for gonorrhea, chlamydia, HIV and syphilis. Discussed pending lab work with patient. Plan to discharge patient home with Flagyl for BV and Zofran. Advised pt to follow up with her PCP regarding mildly decreased hgb to in the ED. Advised patient to follow up with her PCP.  Evaluation does not show pathology requring ongoing emergent intervention or admission. Pt is hemodynamically stable and mentating appropriately. Discussed findings/results and plan with patient/guardian, who agrees with plan. All questions answered. Return precautions discussed and outpatient follow up given.      Satira Sark [place], [place] 10/11/15 1906  Loren Racer, MD 10/11/15 2100

## 2015-10-11 NOTE — Discharge Instructions (Signed)
Take your medications as prescribed. Refrain from doing any vaginal douching or vaginal washes/cleanses. Continue drinking fluids to remain hydrated. Follow-up with your primary care provider in the next 4-5 days. Please return to the Emergency Department if symptoms worsen or new onset of fever, vomiting, abdominal pain, vaginal bleeding, vaginal discharge.

## 2015-10-12 LAB — GC/CHLAMYDIA PROBE AMP (~~LOC~~) NOT AT ARMC
CHLAMYDIA, DNA PROBE: NEGATIVE
NEISSERIA GONORRHEA: NEGATIVE

## 2015-10-12 LAB — RPR: RPR: NONREACTIVE

## 2015-10-12 LAB — HIV ANTIBODY (ROUTINE TESTING W REFLEX): HIV SCREEN 4TH GENERATION: NONREACTIVE

## 2015-12-15 ENCOUNTER — Emergency Department (HOSPITAL_BASED_OUTPATIENT_CLINIC_OR_DEPARTMENT_OTHER)
Admission: EM | Admit: 2015-12-15 | Discharge: 2015-12-15 | Disposition: A | Discharge status: Home / Self Care | Payer: No Typology Code available for payment source | Attending: Emergency Medicine | Admitting: Emergency Medicine

## 2015-12-15 ENCOUNTER — Encounter (HOSPITAL_BASED_OUTPATIENT_CLINIC_OR_DEPARTMENT_OTHER): Payer: Self-pay | Admitting: Emergency Medicine

## 2015-12-15 DIAGNOSIS — J020 Streptococcal pharyngitis: Secondary | ICD-10-CM | POA: Insufficient documentation

## 2015-12-15 DIAGNOSIS — Z792 Long term (current) use of antibiotics: Secondary | ICD-10-CM | POA: Insufficient documentation

## 2015-12-15 LAB — RAPID STREP SCREEN (MED CTR MEBANE ONLY): STREPTOCOCCUS, GROUP A SCREEN (DIRECT): POSITIVE — AB

## 2015-12-15 MED ORDER — PENICILLIN V POTASSIUM 250 MG PO TABS
500.0000 mg | ORAL_TABLET | Freq: Four times a day (QID) | ORAL | Status: DC
  Filled 2015-12-15: qty 2

## 2015-12-15 MED ORDER — PENICILLIN G BENZATHINE 1200000 UNIT/2ML IM SUSP
1.2000 10*6.[IU] | Freq: Once | INTRAMUSCULAR | Status: AC
  Administered 2015-12-15: 1.2 10*6.[IU] via INTRAMUSCULAR
  Filled 2015-12-15: qty 2

## 2015-12-15 MED ORDER — PENICILLIN V POTASSIUM 500 MG PO TABS: 500.0000 mg | ORAL_TABLET | Freq: Three times a day (TID) | ORAL | Status: DC

## 2015-12-15 NOTE — ED Notes (Signed)
C/o sorethroat, cough non productive x 2 days

## 2015-12-15 NOTE — ED Provider Notes (Signed)
6CSN: [id number]     Arrival date & time 12/15/15  2001 History  By signing my name below, I, Katie Wheeler, attest that this documentation has been prepared under the direction and in the presence of Katie Porter, MD. Electronically Signed: Budd Wheeler, ED Scribe. 12/15/2015. 9:47 PM.      Chief Complaint  Patient presents with  . Sore Throat   The history is provided by the patient. No language interpreter was used.   HPI Comments: Katie Wheeler is a 20 y.o. female who presents to the Emergency Department complaining of a scratchy sore throat onset 1 day ago. She reports associated cough. She notes exacerbation of the sore throat with coughing and swallowing. She states that this feels like a previous episode of strep throat.    History reviewed. No pertinent past medical history. History reviewed. No pertinent past surgical history. History reviewed. No pertinent family history. Social History  Substance Use Topics  . Smoking status: Never Smoker   . Smokeless tobacco: None  . Alcohol Use: No   OB History    Gravida Para Term Preterm AB TAB SAB Ectopic Multiple Living   1 1             Review of Systems  Constitutional: Negative for fever, chills, diaphoresis, appetite change and fatigue.  HENT: Positive for sore throat. Negative for mouth sores and trouble swallowing.   Eyes: Negative for visual disturbance.  Respiratory: Positive for cough. Negative for chest tightness, shortness of breath and wheezing.   Cardiovascular: Negative for chest pain.  Gastrointestinal: Negative for nausea, vomiting, abdominal pain, diarrhea and abdominal distention.  Endocrine: Negative for polydipsia, polyphagia and polyuria.  Genitourinary: Negative for dysuria, frequency and hematuria.  Musculoskeletal: Negative for gait problem.  Skin: Negative for color change, pallor and rash.  Neurological: Negative for dizziness, syncope, light-headedness and headaches.  Hematological: Does not  bruise/bleed easily.  Psychiatric/Behavioral: Negative for behavioral problems and confusion.    Allergies  Review of patient's allergies indicates no known allergies.  Home Medications   Prior to Admission medications   Medication Sig Start Date End Date Taking? Authorizing Provider  metroNIDAZOLE (FLAGYL) 500 MG tablet Take 1 tablet (500 mg total) by mouth 2 (two) times daily. 10/11/15   Barrett Henle, PA-C  ondansetron (ZOFRAN ODT) 4 MG disintegrating tablet Take 1 tablet (4 mg total) by mouth every 8 (eight) hours as needed for nausea or vomiting. 10/11/15   Barrett Henle, PA-C  penicillin v potassium (VEETID) 500 MG tablet Take 1 tablet (500 mg total) by mouth 3 (three) times daily. 12/15/15   Katie Porter, MD   BP 116/85 mmHg  Pulse 98  Temp(Src) 98.9 F (37.2 C) (Oral)  Ht  (1.651 m)  Wt 140 lb (63.504 kg)  BMI 23.30 kg/m2  SpO2 100%  LMP 11/14/2015 Physical Exam  Constitutional: She is oriented to person, place, and time. She appears well-developed and well-nourished. No distress.  HENT:  Head: Normocephalic.  Mouth/Throat: Oropharyngeal exudate present.  2+ tonsils with white exudate  Eyes: Conjunctivae are normal. Pupils are equal, round, and reactive to light. No scleral icterus.  Neck: Normal range of motion. Neck supple. No thyromegaly present.  Left-sided cervical lymphadenopathy  Cardiovascular: Normal rate and regular rhythm.  Exam reveals no gallop and no friction rub.   No murmur heard. Pulmonary/Chest: Effort normal and breath sounds normal. No respiratory distress. She has no wheezes. She has no rales.  Abdominal: Soft. Bowel sounds are normal.  She exhibits no distension. There is no tenderness. There is no rebound.  Musculoskeletal: Normal range of motion.  Lymphadenopathy:    She has cervical adenopathy.  Neurological: She is alert and oriented to person, place, and time.  Skin: Skin is warm and dry. No rash noted.  Psychiatric: She  has a normal mood and affect. Her behavior is normal.    ED Course  Procedures  DIAGNOSTIC STUDIES: Oxygen Saturation is 100% on RA, normal by my interpretation.    COORDINATION OF CARE: 9:45 PM - Discussed positive strept test and plans to prescribe antibiotics. Pt advised of plan for treatment and pt agrees.  Labs Review Labs Reviewed  RAPID STREP SCREEN ([facility]) - Abnormal; Notable for the following:    Streptococcus, Group A Screen (Direct) POSITIVE (*)    All other components within normal limits    Imaging Review No results found. I have personally reviewed and evaluated these images and lab results as part of my medical decision-making.   EKG Interpretation None      MDM   Final diagnoses:  Strep pharyngitis    /I personally performed the services described in this documentation, which was scribed in my presence. The recorded information has been reviewed and is accurate.   Katie PorterMark Moira Umholtz, MD 12/19/15 [phone]

## 2015-12-15 NOTE — ED Notes (Signed)
Patient states that she has a sore throat and feels like when she has strep throat

## 2015-12-15 NOTE — Discharge Instructions (Signed)

## 2016-03-17 ENCOUNTER — Encounter (HOSPITAL_BASED_OUTPATIENT_CLINIC_OR_DEPARTMENT_OTHER): Payer: Self-pay | Admitting: *Deleted

## 2016-03-17 ENCOUNTER — Emergency Department (HOSPITAL_BASED_OUTPATIENT_CLINIC_OR_DEPARTMENT_OTHER)
Admission: EM | Admit: 2016-03-17 | Discharge: 2016-03-17 | Disposition: A | Discharge status: Home / Self Care | Payer: Medicaid Other | Attending: Emergency Medicine | Admitting: Emergency Medicine

## 2016-03-17 DIAGNOSIS — K219 Gastro-esophageal reflux disease without esophagitis: Secondary | ICD-10-CM | POA: Insufficient documentation

## 2016-03-17 DIAGNOSIS — N926 Irregular menstruation, unspecified: Secondary | ICD-10-CM | POA: Insufficient documentation

## 2016-03-17 LAB — PREGNANCY, URINE: Preg Test, Ur: NEGATIVE

## 2016-03-17 MED ORDER — GI COCKTAIL ~~LOC~~
30.0000 mL | Freq: Once | ORAL | Status: AC
  Administered 2016-03-17: 30 mL via ORAL
  Filled 2016-03-17: qty 30

## 2016-03-17 MED ORDER — FAMOTIDINE 20 MG PO TABS
20.0000 mg | ORAL_TABLET | Freq: Once | ORAL | Status: AC
  Administered 2016-03-17: 20 mg via ORAL
  Filled 2016-03-17: qty 1

## 2016-03-17 NOTE — ED Provider Notes (Signed)
[facility] Note   CSN: [id number] Arrival date & time: 03/17/16  1422  First Provider Contact:  None    By signing my name below, I, Majel Homer, attest that this documentation has been prepared under the direction and in the presence of Laurence Spates, MD. Electronically Signed: Majel Homer, Scribe. 03/17/2016. 6:42 PM.  History   Chief Complaint Chief Complaint  Patient presents with  . Gastroesophageal Reflux    HPI HPI Comments: Katie Wheeler is a 20 y.o. female with PMHx of GERD, who presents to the Emergency Department complaining of gradually worsening, heartburn and nausea that began ~1 week ago and worsened yesterday. Pt reports her heartburn is constant; she notes she is experiencing heartburn now in the ED. She states she took tums last night but experienced 1 episode of vomiting immediately afterwards. She states she has not taken any other medications for her symptoms. Pt notes associated lower abdominal cramps that began around the onset of her menstrual period.  Pt states she noticed her menstrual period was irregular this month; she reports her period is usually very heavy for 5 days but only noticed slight spotting this month. She was slightly concerned about possible pregnancy. She denies shortness of breath, sick contacts and any allergies to medications. Pt states she is not currently taking birth control medication.   The history is provided by the patient. No language interpreter was used.    History reviewed. No pertinent past medical history.  There are no active problems to display for this patient.   History reviewed. No pertinent surgical history.  OB History    Gravida Para Term Preterm AB Living   1 1           SAB TAB Ectopic Multiple Live Births                   Home Medications    Prior to Admission medications   Not on File    Family History History reviewed. No pertinent family history.  Social  History Social History  Substance Use Topics  . Smoking status: Never Smoker  . Smokeless tobacco: Never Used  . Alcohol use No     Allergies   Review of patient's allergies indicates no known allergies.   Review of Systems Review of Systems  10 Systems reviewed and are negative for acute change except as noted in the HPI.  Physical Exam Updated Vital Signs BP 119/73 (BP Location: Right Arm)   Pulse 86   Temp 97.8 F (36.6 C) (Oral)   Resp 18   Ht 5\' 5"  (1.651 m)   Wt 140 lb (63.5 kg)   LMP 02/15/2016 (Exact Date)   SpO2 100%   BMI 23.30 kg/m   Physical Exam  Constitutional: She is oriented to person, place, and time. She appears well-developed and well-nourished. No distress.  HENT:  Head: Normocephalic and atraumatic.  Moist mucous membranes  Eyes: Conjunctivae are normal. Pupils are equal, round, and reactive to light.  Neck: Neck supple.  Cardiovascular: Normal rate, regular rhythm and normal heart sounds.   No murmur heard. Pulmonary/Chest: Effort normal and breath sounds normal.  Abdominal: Soft. Bowel sounds are normal. She exhibits no distension. There is no tenderness.  Musculoskeletal: She exhibits no edema.  Neurological: She is alert and oriented to person, place, and time.  Fluent speech  Skin: Skin is warm and dry.  Psychiatric: She has a normal mood and affect. Judgment normal.  Nursing note  and vitals reviewed.    ED Treatments / Results  Labs (all labs ordered are listed, but only abnormal results are displayed) Labs Reviewed  PREGNANCY, URINE    EKG  EKG Interpretation None       Radiology No results found.  Procedures Procedures  DIAGNOSTIC STUDIES:  Oxygen Saturation is 18% on RA, normal by my interpretation.    COORDINATION OF CARE:  6:12 PM Discussed treatment plan with pt at bedside and pt agreed to plan.   Medications Ordered in ED Medications  famotidine (PEPCID) tablet 20 mg (20 mg Oral Given 03/17/16 1651)  gi  cocktail (Maalox,Lidocaine,Donnatal) (30 mLs Oral Given 03/17/16 1652)   Initial Impression / Assessment and Plan / ED Course  I have reviewed the triage vital signs and the nursing notes.  Pertinent labs & imaging results that were available during my care of the patient were reviewed by me and considered in my medical decision making (see chart for details).  Clinical Course   Patient presents with 1 week of heartburn symptoms as well as mild nausea and irregular periods this month. She was well-appearing on exam with normal vital signs. No abdominal tenderness. Gave GI cocktail and Pepcid for her reflux symptoms. She has not currently tried any medications and I have recommended that she begin Pepcid or Zantac daily. Vomiting or diarrhea and no abdominal tenderness to suggest acute intra-abdominal process and I do not feel she needs lab work today. Regarding her menstruation, her UPT is negative and I instructed her to follow-up with OB/GYN to discuss further workup and management. Return precautions reviewed and patient discharged in satisfactory condition.  Final Clinical Impressions(s) / ED Diagnoses   Final diagnoses:  Gastroesophageal reflux disease, esophagitis presence not specified  Irregular menstrual bleeding    New Prescriptions There are no discharge medications for this patient.  I personally performed the services described in this documentation, which was scribed in my presence. The recorded information has been reviewed and is accurate.     Laurence Spates, MD 03/18/16 0100

## 2016-03-17 NOTE — ED Triage Notes (Addendum)
Pt reports heartburn-denies taking medication for it.  States that it is getting worse.  Increased discomfort with eating and drinking. Pt reports tums reduced pain.  Pt reports that her periods have been 'off' for the past 2 months-states that she thinks that she might be pregnant.

## 2016-03-17 NOTE — Discharge Instructions (Signed)
YOU MAY START TAKING ZANTAC OR PEPCID AS PRESCRIBED ON BOTTLE, DAILY. IF YOU HAVE REACHED MAX DOSE OF THIS MEDICATION WITHOUT ANY CHANGE IN SYMPTOMS AFTER 1 MONTH, YOU MAY SWITCH TO NEXIUM, PRILOSEC OR PREVACID.

## 2016-12-19 ENCOUNTER — Emergency Department (HOSPITAL_BASED_OUTPATIENT_CLINIC_OR_DEPARTMENT_OTHER)
Admission: EM | Admit: 2016-12-19 | Discharge: 2016-12-19 | Disposition: A | Discharge status: Home / Self Care | Payer: Medicaid Other | Attending: Emergency Medicine | Admitting: Emergency Medicine

## 2016-12-19 ENCOUNTER — Encounter (HOSPITAL_BASED_OUTPATIENT_CLINIC_OR_DEPARTMENT_OTHER): Payer: Self-pay | Admitting: *Deleted

## 2016-12-19 DIAGNOSIS — J069 Acute upper respiratory infection, unspecified: Secondary | ICD-10-CM

## 2016-12-19 LAB — RAPID STREP SCREEN (MED CTR MEBANE ONLY): STREPTOCOCCUS, GROUP A SCREEN (DIRECT): NEGATIVE

## 2016-12-19 MED ORDER — DM-GUAIFENESIN ER 60-1200 MG PO TB12: 1.0000 | ORAL_TABLET | Freq: Two times a day (BID) | ORAL | 0 refills | Status: DC

## 2016-12-19 NOTE — ED Triage Notes (Signed)
Pt c/o URI symptoms x 2 days 

## 2016-12-19 NOTE — ED Provider Notes (Signed)
[facility] Provider Note   CSN: [id number] Arrival date & time: 12/19/16  1637  By signing my name below, I, Modena Jansky, attest that this documentation has been prepared under the direction and in the presence of non-physician practitioner, Arthor Captain, PA-C. Electronically Signed: Modena Jansky, Scribe. 12/19/2016. 5:10 PM.  History   Chief Complaint Chief Complaint  Patient presents with  . URI   The history is provided by the patient. No language interpreter was used.   HPI Comments: Katie Wheeler is a 21 y.o. female who presents to the Emergency Department complaining of intermittent moderate dry cough that started about 2 days ago. She took Advil PM with no relief yesterday. She reports associated sore throat, subjective fever (yesterday, none today), and generalized myalgias. Pt's temperature in the ED today was 98.6 F. She admits to a sick contact. Denies any other complaints at this time.    PCP: [facility] (Inactive)  History reviewed. No pertinent past medical history.  There are no active problems to display for this patient.   History reviewed. No pertinent surgical history.  OB History    Gravida Para Term Preterm AB Living   1 1           SAB TAB Ectopic Multiple Live Births                   Home Medications    Prior to Admission medications   Not on File    Family History History reviewed. No pertinent family history.  Social History Social History  Substance Use Topics  . Smoking status: Never Smoker  . Smokeless tobacco: Never Used  . Alcohol use No     Allergies   Patient has no known allergies.   Review of Systems Review of Systems  Constitutional: Positive for fever (Subjective).  HENT: Positive for sore throat.   Musculoskeletal: Positive for myalgias (Generalized).     Physical Exam Updated Vital Signs BP 114/74   Pulse 99   Temp 98.6 F (37 C) (Oral)   Resp 16   Ht   (1.676 m)   Wt 140 lb (63.5 kg)   LMP 12/19/2016   SpO2 100%   BMI 22.60 kg/m   Physical Exam  Constitutional: She appears well-developed and well-nourished. No distress.  HENT:  Head: Normocephalic.  Mouth/Throat: Uvula is midline. Tonsillar exudate.  Cobblestoning in the posterior pharynx.   Eyes: Conjunctivae are normal.  Neck: Neck supple.  Cardiovascular: Normal rate and regular rhythm.   Pulmonary/Chest: Effort normal. No respiratory distress. She has no wheezes. She has no rales.  Dry cough noted.   Abdominal: Soft.  Musculoskeletal: Normal range of motion.  Neurological: She is alert.  Skin: Skin is warm and dry.  Psychiatric: She has a normal mood and affect.  Nursing note and vitals reviewed.    ED Treatments / Results  DIAGNOSTIC STUDIES: Oxygen Saturation is 100% on RA, normal by my interpretation.    COORDINATION OF CARE: 5:15 PM- Pt advised of plan for treatment and pt agrees.  Labs (all labs ordered are listed, but only abnormal results are displayed) Labs Reviewed  RAPID STREP SCREEN (NOT AT [facility])    EKG  EKG Interpretation None       Radiology No results found.  Procedures Procedures (including critical care time)  Medications Ordered in ED Medications - No data to display   Initial Impression / Assessment and Plan / ED Course  I  have reviewed the triage vital signs and the nursing notes.  Pertinent labs & imaging results that were available during my care of the patient were reviewed by me and considered in my medical decision making (see chart for details).     . Patients symptoms are consistent with URI, likely viral etiology. Discussed that antibiotics are not indicated for viral infections. Pt will be discharged with symptomatic treatment.  Verbalizes understanding and is agreeable with plan. Pt is hemodynamically stable & in NAD prior to dc.   Final Clinical Impressions(s) / ED Diagnoses   Final diagnoses:  None    New  Prescriptions New Prescriptions   No medications on file   I personally performed the services described in this documentation, which was scribed in my presence. The recorded information has been reviewed and is accurate.        Arthor Captain, PA-C 12/19/16 1744    Canary Brim Tegeler, MD 12/20/16 0111

## 2016-12-19 NOTE — Discharge Instructions (Signed)
You appear to have an upper respiratory infection (URI). An upper respiratory tract infection, or cold, is a viral infection of the air passages leading to the lungs. It is contagious and can be spread to others, especially during the first 3 or 4 days. It cannot be cured by antibiotics or other medicines. °RETURN IMMEDIATELY IF you develop shortness of breath, confusion or altered mental status, a new rash, become dizzy, faint, or poorly responsive, or are unable to be cared for at home. ° °

## 2016-12-20 LAB — CULTURE, GROUP A STREP (THRC)
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
## 2017-03-25 ENCOUNTER — Emergency Department (HOSPITAL_BASED_OUTPATIENT_CLINIC_OR_DEPARTMENT_OTHER)
Admission: EM | Admit: 2017-03-25 | Discharge: 2017-03-25 | Disposition: A | Discharge status: Home / Self Care | Payer: Medicaid Other | Attending: Emergency Medicine | Admitting: Emergency Medicine

## 2017-03-25 ENCOUNTER — Encounter (HOSPITAL_BASED_OUTPATIENT_CLINIC_OR_DEPARTMENT_OTHER): Payer: Self-pay | Admitting: Emergency Medicine

## 2017-03-25 DIAGNOSIS — B9689 Other specified bacterial agents as the cause of diseases classified elsewhere: Secondary | ICD-10-CM

## 2017-03-25 DIAGNOSIS — N760 Acute vaginitis: Secondary | ICD-10-CM

## 2017-03-25 LAB — URINALYSIS, ROUTINE W REFLEX MICROSCOPIC
Bilirubin Urine: NEGATIVE
GLUCOSE, UA: NEGATIVE mg/dL
HGB URINE DIPSTICK: NEGATIVE
Ketones, ur: NEGATIVE mg/dL
Nitrite: NEGATIVE
PH: 6.5 (ref 5.0–8.0)
Protein, ur: NEGATIVE mg/dL
SPECIFIC GRAVITY, URINE: 1.007 (ref 1.005–1.030)

## 2017-03-25 LAB — URINALYSIS, MICROSCOPIC (REFLEX): RBC / HPF: NONE SEEN RBC/hpf (ref 0–5)

## 2017-03-25 LAB — WET PREP, GENITAL
Sperm: NONE SEEN
Trich, Wet Prep: NONE SEEN
Yeast Wet Prep HPF POC: NONE SEEN

## 2017-03-25 LAB — PREGNANCY, URINE: Preg Test, Ur: NEGATIVE

## 2017-03-25 MED ORDER — METRONIDAZOLE 500 MG PO TABS: 500.0000 mg | ORAL_TABLET | Freq: Two times a day (BID) | ORAL | 0 refills | Status: AC

## 2017-03-25 MED ORDER — METRONIDAZOLE 500 MG PO TABS
500.0000 mg | ORAL_TABLET | Freq: Once | ORAL | Status: AC
  Administered 2017-03-25: 500 mg via ORAL
  Filled 2017-03-25: qty 1

## 2017-03-25 NOTE — ED Notes (Signed)
Pt verbalized understanding of discharge instructions and denies any further questions at this time.   

## 2017-03-25 NOTE — ED Notes (Signed)
Pt in bathroom

## 2017-03-25 NOTE — ED Provider Notes (Signed)
[facility]   CSN: [id number] Arrival date & time: 03/25/17  1839  By signing my name below, I, Thelma Bargeick Cochran, attest that this documentation has been prepared under the direction and in the presence of [facility] Hospital[facility]ina Alireza Pollack, PA-C. Electronically Signed: Thelma BargeNick Cochran, Scribe. 03/25/17. 7:16 PM.  History   Chief Complaint Chief Complaint  Patient presents with  . Urinary Tract Infection   The history is provided by the patient. No language interpreter was used.    HPI Comments: Katie Wheeler is a 21 y.o. female who presents to the Emergency Department complaining of acute onset, unchanging fullness of her bladder after she voids for 3 days. She has associated urinary frequency, white vaginal discharge, and vaginal itching. She has tried a Environmental education officerone-day monostat with no relief. Pt is sexually active and has occasional unprotected sex with only one female partner, who denies any symptoms. Pt has had a UTI and yeast infection in the past and her symptoms feel similar to either. She denies vaginal pain, abdominal pain, dysuria, nausea, vomiting, diarrhea, constipation, back pain, fever, chills, hematuria, or abnormal vaginal bleeding.  History reviewed. No pertinent past medical history.  There are no active problems to display for this patient.   History reviewed. No pertinent surgical history.  OB History    Gravida Para Term Preterm AB Living   1 1           SAB TAB Ectopic Multiple Live Births                   Home Medications    Prior to Admission medications   Medication Sig Start Date End Date Taking? Authorizing Provider  Dextromethorphan-Guaifenesin 60-1200 MG 12hr tablet Take 1 tablet by mouth every 12 (twelve) hours. 12/19/16   Arthor CaptainHarris, Abigail, PA-C  metroNIDAZOLE (FLAGYL) 500 MG tablet Take 1 tablet (500 mg total) by mouth 2 (two) times daily. 03/25/17 04/01/17  Jeanie SewerFawze, Laria Grimmett A, PA-C    Family History No family history on file.  Social History Social History    Substance Use Topics  . Smoking status: Never Smoker  . Smokeless tobacco: Never Used  . Alcohol use No     Allergies   Patient has no known allergies.   Review of Systems Review of Systems  Constitutional: Negative for chills and fever.  Gastrointestinal: Negative for abdominal pain, blood in stool, constipation, diarrhea, nausea and vomiting.  Genitourinary: Positive for frequency and vaginal discharge. Negative for dysuria, hematuria, vaginal bleeding and vaginal pain.  Musculoskeletal: Negative for back pain.     Physical Exam Updated Vital Signs BP 111/63 (BP Location: Left Arm)   Pulse 96   Temp 98.3 F (36.8 C) (Oral)   Resp 16   Ht 5\' 6"  (1.676 m)   Wt 65.8 kg (145 lb)   LMP 03/04/2017 (Exact Date)   SpO2 100%   BMI 23.40 kg/m   Physical Exam  Constitutional: She appears well-developed and well-nourished. No distress.  HENT:  Head: Normocephalic and atraumatic.  Eyes: Conjunctivae are normal. Right eye exhibits no discharge. Left eye exhibits no discharge.  Neck: No JVD present. No tracheal deviation present.  Cardiovascular: Normal rate, regular rhythm and normal heart sounds.   Pulmonary/Chest: Effort normal and breath sounds normal.  Abdominal: Soft. Bowel sounds are normal. She exhibits no distension. There is no tenderness.  No CVA tenderness, Murphy's sign absent, Rovsing sign absent  Genitourinary:  Genitourinary Comments: Examination performed in the presence of a chaperone. No lesions to  the external genitalia. Copious white somewhat chunky discharge in the vaginal vault. Cervical os is closed with no evidence of bleeding. No petechia. No lesions or masses to the vaginal wall. No cervical motion tenderness, no adnexal tenderness. Uterus is soft and symmetrical.  Musculoskeletal: She exhibits no edema.  Neurological: She is alert.  Skin: Skin is warm and dry. No erythema.  Psychiatric: She has a normal mood and affect. Her behavior is normal.   Nursing note and vitals reviewed.    ED Treatments / Results  DIAGNOSTIC STUDIES: Oxygen Saturation is 100% on RA, normal by my interpretation.    COORDINATION OF CARE: 7:15 PM Discussed treatment plan with pt at bedside and pt agreed to plan.  Labs (all labs ordered are listed, but only abnormal results are displayed) Labs Reviewed  WET PREP, GENITAL - Abnormal; Notable for the following:       Result Value   Clue Cells Wet Prep HPF POC PRESENT (*)    WBC, Wet Prep HPF POC MANY (*)    All other components within normal limits  URINALYSIS, ROUTINE W REFLEX MICROSCOPIC - Abnormal; Notable for the following:    APPearance CLOUDY (*)    Leukocytes, UA TRACE (*)    All other components within normal limits  URINALYSIS, MICROSCOPIC (REFLEX) - Abnormal; Notable for the following:    Bacteria, UA RARE (*)    Squamous Epithelial / LPF 6-30 (*)    All other components within normal limits  URINE CULTURE  PREGNANCY, URINE  RPR  HIV ANTIBODY (ROUTINE TESTING)  GC/CHLAMYDIA PROBE AMP ([place]    EKG  EKG Interpretation None       Radiology No results found.  Procedures Procedures (including critical care time)  Medications Ordered in ED Medications  metroNIDAZOLE (FLAGYL) tablet 500 mg (not administered)     Initial Impression / Assessment and Plan / ED Course  I have reviewed the triage vital signs and the nursing notes.  Pertinent labs & imaging results that were available during my care of the patient were reviewed by me and considered in my medical decision making (see chart for details).     Patient with complaints of vaginal discharge and urinary fullness for one day. Afebrile, vital signs are stable. Abdominal examination is unremarkable. UA not concerning for UTI, sent for culture. Wet prep shows WBCs and clue cells consistent with BV. Doubt PID, ectopic, TOA, ovarian torsion, or other intra-abdominal pathology for symptoms in the absence of  pain or fever. Will treat with Flagyl, discussed indications for return to the ED. She will follow up with her primary care doctor or OB/GYN for reevaluation if symptoms persist or worsen. Pt verbalized understanding of and agreement with plan and is safe for discharge home at this time.   Final Clinical Impressions(s) / ED Diagnoses   Final diagnoses:  BV (bacterial vaginosis)    New Prescriptions New Prescriptions   METRONIDAZOLE (FLAGYL) 500 MG TABLET    Take 1 tablet (500 mg total) by mouth 2 (two) times daily.  I personally performed the services described in this documentation, which was scribed in my presence. The recorded information has been reviewed and is accurate.    Bennye AlmFawze, Ricka Westra A, PA-C 03/25/17 2126    Arby BarrettePfeiffer, Marcy, MD 03/26/17 1215

## 2017-03-25 NOTE — Discharge Instructions (Signed)
Please take all of your antibiotics until finished!   You may develop abdominal discomfort or diarrhea from the antibiotic.  You may help offset this with probiotics which you can buy or get in yogurt. Do not eat  or take the probiotics until 2 hours after your antibiotic. If any of your remaining lab work is abnormal, you will be informed. Follow-up with your primary care physician for reevaluation if symptoms persist or worsen. Return to the ED immediately if any concerning signs or symptoms develop such as abdominal pain, abnormal vaginal bleeding, fevers, or chills.

## 2017-03-25 NOTE — ED Triage Notes (Signed)
Pt reports feeling like when she voids, she isn't emptying her bladder. There is an odor but not pain or blood.

## 2017-03-27 LAB — URINE CULTURE

## 2017-03-27 LAB — HIV ANTIBODY (ROUTINE TESTING W REFLEX): HIV Screen 4th Generation wRfx: NONREACTIVE

## 2017-03-27 LAB — RPR: RPR Ser Ql: NONREACTIVE

## 2017-03-28 LAB — GC/CHLAMYDIA PROBE AMP (~~LOC~~) NOT AT ARMC
Chlamydia: NEGATIVE
Neisseria Gonorrhea: NEGATIVE

## 2017-05-21 DIAGNOSIS — Z3A01 Less than 8 weeks gestation of pregnancy: Secondary | ICD-10-CM | POA: Insufficient documentation

## 2017-05-21 DIAGNOSIS — Z79899 Other long term (current) drug therapy: Secondary | ICD-10-CM | POA: Insufficient documentation

## 2017-05-21 DIAGNOSIS — Y939 Activity, unspecified: Secondary | ICD-10-CM | POA: Insufficient documentation

## 2017-05-21 DIAGNOSIS — O9989 Other specified diseases and conditions complicating pregnancy, childbirth and the puerperium: Secondary | ICD-10-CM | POA: Insufficient documentation

## 2017-05-22 ENCOUNTER — Encounter: Payer: Self-pay | Admitting: Emergency Medicine

## 2017-05-22 ENCOUNTER — Emergency Department (HOSPITAL_BASED_OUTPATIENT_CLINIC_OR_DEPARTMENT_OTHER)
Admission: EM | Admit: 2017-05-22 | Discharge: 2017-05-22 | Disposition: A | Discharge status: Home / Self Care | Payer: Self-pay | Attending: Emergency Medicine | Admitting: Emergency Medicine

## 2017-05-22 DIAGNOSIS — Z3490 Encounter for supervision of normal pregnancy, unspecified, unspecified trimester: Secondary | ICD-10-CM

## 2017-05-22 LAB — URINALYSIS, MICROSCOPIC (REFLEX)

## 2017-05-22 LAB — CBC WITH DIFFERENTIAL/PLATELET
Basophils Absolute: 0 10*3/uL (ref 0.0–0.1)
Basophils Relative: 0 %
EOS PCT: 2 %
Eosinophils Absolute: 0.2 10*3/uL (ref 0.0–0.7)
HCT: 33.6 % — ABNORMAL LOW (ref 36.0–46.0)
Hemoglobin: 10.9 g/dL — ABNORMAL LOW (ref 12.0–15.0)
LYMPHS ABS: 4 10*3/uL (ref 0.7–4.0)
LYMPHS PCT: 48 %
MCH: 26.1 pg (ref 26.0–34.0)
MCHC: 32.4 g/dL (ref 30.0–36.0)
MCV: 80.6 fL (ref 78.0–100.0)
MONO ABS: 0.7 10*3/uL (ref 0.1–1.0)
Monocytes Relative: 8 %
Neutro Abs: 3.4 10*3/uL (ref 1.7–7.7)
Neutrophils Relative %: 42 %
Platelets: 252 10*3/uL (ref 150–400)
RBC: 4.17 MIL/uL (ref 3.87–5.11)
RDW: 12.8 % (ref 11.5–15.5)
WBC: 8.3 10*3/uL (ref 4.0–10.5)

## 2017-05-22 LAB — BASIC METABOLIC PANEL
Anion gap: 5 (ref 5–15)
BUN: 8 mg/dL (ref 6–20)
CO2: 24 mmol/L (ref 22–32)
Calcium: 8.4 mg/dL — ABNORMAL LOW (ref 8.9–10.3)
Chloride: 108 mmol/L (ref 101–111)
Creatinine, Ser: 0.49 mg/dL (ref 0.44–1.00)
GFR calc Af Amer: >60 mL/min (ref >60)
GLUCOSE: 90 mg/dL (ref 65–99)
POTASSIUM: 3.8 mmol/L (ref 3.5–5.1)
Sodium: 137 mmol/L (ref 135–145)

## 2017-05-22 LAB — URINALYSIS, ROUTINE W REFLEX MICROSCOPIC
Bilirubin Urine: NEGATIVE
Glucose, UA: NEGATIVE mg/dL
KETONES UR: NEGATIVE mg/dL
NITRITE: NEGATIVE
PROTEIN: NEGATIVE mg/dL
Specific Gravity, Urine: 1.015 (ref 1.005–1.030)
pH: 7 (ref 5.0–8.0)

## 2017-05-22 NOTE — ED Notes (Signed)
Pt verbalizes understanding of d/c instructions and denies any further needs at this time. 

## 2017-05-22 NOTE — ED Provider Notes (Signed)
[facility] Provider Note: Lowella Dell, MD, FACEP  CSN: [id number] MRN: [id number] ARRIVAL: 05/21/17 at 2357 ROOM: [facility]06/[facility]06   CHIEF COMPLAINT  Dry Mouth   HISTORY OF PRESENT ILLNESS  05/22/17 2:19 AM Katie Wheeler is a 21 y.o. female who is about [redacted] weeks pregnant. She had a positive pregnancy test on the 17th of this month. She is here with about a week of intermittent lightheadedness, intermittent generalized weakness and dry mouth which have worsened over the past 2 days. She denies abdominal pain, vaginal bleeding, vomiting or fever. She is concerned she may be dehydrated. She has been drinking sports drinks to keep herself hydrated.   History reviewed. No pertinent past medical history.  History reviewed. No pertinent surgical history.  No family history on file.  Social History  Substance Use Topics  . Smoking status: Never Smoker  . Smokeless tobacco: Never Used  . Alcohol use No    Prior to Admission medications   Medication Sig Start Date End Date Taking? Authorizing Provider  Dextromethorphan-Guaifenesin 60-1200 MG 12hr tablet Take 1 tablet by mouth every 12 (twelve) hours. 12/19/16   Arthor Captain, PA-C    Allergies Patient has no known allergies.   REVIEW OF SYSTEMS  Negative except as noted here or in the History of Present Illness.   PHYSICAL EXAMINATION  Initial Vital Signs Blood pressure 123/71, pulse 89, temperature 98.5 F (36.9 C), temperature source Oral, resp. rate 18, height  (1.676 m), weight 64.9 kg (143 lb), last menstrual period 04/07/2017, SpO2 100 %.  Examination General: Well-developed, well-nourished female in no acute distress; appearance consistent with age of record HENT: normocephalic; atraumatic; oral mucosae pink and moist Eyes: pupils equal, round and reactive to light; extraocular muscles intact Neck: supple Heart: regular rate and rhythm; no murmurs, rubs or gallops Lungs: clear to auscultation  bilaterally Abdomen: soft; nondistended; nontender; no masses or hepatosplenomegaly; bowel sounds present GU: No CVA tenderness Extremities: No deformity; full range of motion; pulses normal Neurologic: Awake, alert and oriented; motor function intact in all extremities and symmetric; no facial droop Skin: Warm and dry Psychiatric: Normal mood and affect   RESULTS  Summary of this visit's results, reviewed by myself:   EKG Interpretation  Date/Time:    Ventricular Rate:    PR Interval:    QRS Duration:   QT Interval:    QTC Calculation:   R Axis:     Text Interpretation:        Laboratory Studies: Results for orders placed or performed during the hospital encounter of 05/22/17 (from the past 24 hour(s))  Urinalysis, Routine w reflex microscopic     Status: Abnormal   Collection Time: 05/22/17 12:07 AM  Result Value Ref Range   Color, Urine YELLOW YELLOW   APPearance CLOUDY (A) CLEAR   Specific Gravity, Urine 1.015 1.005 - 1.030   pH 7.0 5.0 - 8.0   Glucose, UA NEGATIVE NEGATIVE mg/dL   Hgb urine dipstick TRACE (A) NEGATIVE   Bilirubin Urine NEGATIVE NEGATIVE   Ketones, ur NEGATIVE NEGATIVE mg/dL   Protein, ur NEGATIVE NEGATIVE mg/dL   Nitrite NEGATIVE NEGATIVE   Leukocytes, UA SMALL (A) NEGATIVE  Urinalysis, Microscopic (reflex)     Status: Abnormal   Collection Time: 05/22/17 12:07 AM  Result Value Ref Range   RBC / HPF 0-5 0 - 5 RBC/hpf   WBC, UA 0-5 0 - 5 WBC/hpf   Bacteria, UA FEW (A) NONE SEEN   Squamous Epithelial /  LPF 0-5 (A) NONE SEEN   Amorphous Crystal PRESENT   CBC with Differential/Platelet     Status: Abnormal   Collection Time: 05/22/17  2:27 AM  Result Value Ref Range   WBC 8.3 4.0 - 10.5 K/uL   RBC 4.17 3.87 - 5.11 MIL/uL   Hemoglobin 10.9 (L) 12.0 - 15.0 g/dL   HCT 40.9 (L) 81.1 - 91.4 %   MCV 80.6 78.0 - 100.0 fL   MCH 26.1 26.0 - 34.0 pg   MCHC 32.4 30.0 - 36.0 g/dL   RDW 78.2 95.6 - 21.3 %   Platelets 252 150 - 400 K/uL   Neutrophils  Relative % 42 %   Neutro Abs 3.4 1.7 - 7.7 K/uL   Lymphocytes Relative 48 %   Lymphs Abs 4.0 0.7 - 4.0 K/uL   Monocytes Relative 8 %   Monocytes Absolute 0.7 0.1 - 1.0 K/uL   Eosinophils Relative 2 %   Eosinophils Absolute 0.2 0.0 - 0.7 K/uL   Basophils Relative 0 %   Basophils Absolute 0.0 0.0 - 0.1 K/uL  Basic metabolic panel     Status: Abnormal   Collection Time: 05/22/17  2:27 AM  Result Value Ref Range   Sodium 137 135 - 145 mmol/L   Potassium 3.8 3.5 - 5.1 mmol/L   Chloride 108 101 - 111 mmol/L   CO2 24 22 - 32 mmol/L   Glucose, Bld 90 65 - 99 mg/dL   BUN 8 6 - 20 mg/dL   Creatinine, Ser 0.86 0.44 - 1.00 mg/dL   Calcium 8.4 (L) 8.9 - 10.3 mg/dL   GFR calc non Af Amer >60 >60 mL/min   GFR calc Af Amer >60 >60 mL/min   Anion gap 5 5 - 15   Imaging Studies: No results found.  ED COURSE  Nursing notes and initial vitals signs, including pulse oximetry, reviewed.  Vitals:   05/22/17 0002  BP: 123/71  Pulse: 89  Resp: 18  Temp: 98.5 F (36.9 C)  TempSrc: Oral  SpO2: 100%  Weight: 64.9 kg (143 lb)  Height:  (1.676 m)   Patient advised of reassuring lab studies. Patient is showing no signs of dehydration.  PROCEDURES    ED DIAGNOSES     ICD-10-CM   1. Pregnancy at early stage Z34.90        Mechille Varghese, Jonny Ruiz, MD 05/22/17 [phone]

## 2017-05-22 NOTE — ED Triage Notes (Addendum)
Pt c/o dry mouth and tiredness for the last two days, she about 1 month pregnant, denies current pelvic pain, denies vaginal bleeding, denies vomiting, is drinking sport drink in triage, pt does not appear to be dehydrated

## 2020-05-26 DIAGNOSIS — Z419 Encounter for procedure for purposes other than remedying health state, unspecified: Secondary | ICD-10-CM | POA: Diagnosis not present

## 2020-06-26 DIAGNOSIS — Z419 Encounter for procedure for purposes other than remedying health state, unspecified: Secondary | ICD-10-CM | POA: Diagnosis not present

## 2020-07-26 DIAGNOSIS — Z419 Encounter for procedure for purposes other than remedying health state, unspecified: Secondary | ICD-10-CM | POA: Diagnosis not present

## 2020-08-11 DIAGNOSIS — Z0000 Encounter for general adult medical examination without abnormal findings: Secondary | ICD-10-CM | POA: Diagnosis not present

## 2020-08-11 DIAGNOSIS — Z3009 Encounter for other general counseling and advice on contraception: Secondary | ICD-10-CM | POA: Diagnosis not present

## 2020-08-11 DIAGNOSIS — Z800 Family history of malignant neoplasm of digestive organs: Secondary | ICD-10-CM | POA: Diagnosis not present

## 2020-08-11 DIAGNOSIS — Z1151 Encounter for screening for human papillomavirus (HPV): Secondary | ICD-10-CM | POA: Diagnosis not present

## 2020-08-11 DIAGNOSIS — L709 Acne, unspecified: Secondary | ICD-10-CM | POA: Diagnosis not present

## 2020-08-11 DIAGNOSIS — B9689 Other specified bacterial agents as the cause of diseases classified elsewhere: Secondary | ICD-10-CM | POA: Diagnosis not present

## 2020-08-11 DIAGNOSIS — Z833 Family history of diabetes mellitus: Secondary | ICD-10-CM | POA: Diagnosis not present

## 2020-08-11 DIAGNOSIS — Z793 Long term (current) use of hormonal contraceptives: Secondary | ICD-10-CM | POA: Diagnosis not present

## 2020-08-11 DIAGNOSIS — L729 Follicular cyst of the skin and subcutaneous tissue, unspecified: Secondary | ICD-10-CM | POA: Diagnosis not present

## 2020-08-11 DIAGNOSIS — Z809 Family history of malignant neoplasm, unspecified: Secondary | ICD-10-CM | POA: Diagnosis not present

## 2020-08-11 DIAGNOSIS — Z8249 Family history of ischemic heart disease and other diseases of the circulatory system: Secondary | ICD-10-CM | POA: Diagnosis not present

## 2020-08-11 DIAGNOSIS — Z01411 Encounter for gynecological examination (general) (routine) with abnormal findings: Secondary | ICD-10-CM | POA: Diagnosis not present

## 2020-08-11 DIAGNOSIS — Z87410 Personal history of cervical dysplasia: Secondary | ICD-10-CM | POA: Diagnosis not present

## 2020-08-11 DIAGNOSIS — N760 Acute vaginitis: Secondary | ICD-10-CM | POA: Diagnosis not present

## 2020-08-11 DIAGNOSIS — R87619 Unspecified abnormal cytological findings in specimens from cervix uteri: Secondary | ICD-10-CM | POA: Diagnosis not present

## 2020-08-26 DIAGNOSIS — Z419 Encounter for procedure for purposes other than remedying health state, unspecified: Secondary | ICD-10-CM | POA: Diagnosis not present

## 2020-09-26 DIAGNOSIS — Z419 Encounter for procedure for purposes other than remedying health state, unspecified: Secondary | ICD-10-CM | POA: Diagnosis not present

## 2020-10-24 DIAGNOSIS — Z419 Encounter for procedure for purposes other than remedying health state, unspecified: Secondary | ICD-10-CM | POA: Diagnosis not present

## 2020-11-24 DIAGNOSIS — Z419 Encounter for procedure for purposes other than remedying health state, unspecified: Secondary | ICD-10-CM | POA: Diagnosis not present

## 2020-12-24 DIAGNOSIS — Z419 Encounter for procedure for purposes other than remedying health state, unspecified: Secondary | ICD-10-CM | POA: Diagnosis not present

## 2021-01-24 DIAGNOSIS — Z419 Encounter for procedure for purposes other than remedying health state, unspecified: Secondary | ICD-10-CM | POA: Diagnosis not present

## 2021-02-23 DIAGNOSIS — Z419 Encounter for procedure for purposes other than remedying health state, unspecified: Secondary | ICD-10-CM | POA: Diagnosis not present

## 2021-03-26 DIAGNOSIS — Z419 Encounter for procedure for purposes other than remedying health state, unspecified: Secondary | ICD-10-CM | POA: Diagnosis not present

## 2021-04-26 DIAGNOSIS — Z419 Encounter for procedure for purposes other than remedying health state, unspecified: Secondary | ICD-10-CM | POA: Diagnosis not present

## 2021-05-26 DIAGNOSIS — Z419 Encounter for procedure for purposes other than remedying health state, unspecified: Secondary | ICD-10-CM | POA: Diagnosis not present

## 2021-06-26 DIAGNOSIS — Z419 Encounter for procedure for purposes other than remedying health state, unspecified: Secondary | ICD-10-CM | POA: Diagnosis not present

## 2021-07-26 DIAGNOSIS — Z419 Encounter for procedure for purposes other than remedying health state, unspecified: Secondary | ICD-10-CM | POA: Diagnosis not present

## 2021-08-26 DIAGNOSIS — Z419 Encounter for procedure for purposes other than remedying health state, unspecified: Secondary | ICD-10-CM | POA: Diagnosis not present

## 2021-09-26 DIAGNOSIS — Z419 Encounter for procedure for purposes other than remedying health state, unspecified: Secondary | ICD-10-CM | POA: Diagnosis not present

## 2021-10-24 DIAGNOSIS — Z419 Encounter for procedure for purposes other than remedying health state, unspecified: Secondary | ICD-10-CM | POA: Diagnosis not present

## 2021-11-24 DIAGNOSIS — Z419 Encounter for procedure for purposes other than remedying health state, unspecified: Secondary | ICD-10-CM | POA: Diagnosis not present

## 2021-12-24 DIAGNOSIS — Z419 Encounter for procedure for purposes other than remedying health state, unspecified: Secondary | ICD-10-CM | POA: Diagnosis not present

## 2022-01-24 DIAGNOSIS — Z419 Encounter for procedure for purposes other than remedying health state, unspecified: Secondary | ICD-10-CM | POA: Diagnosis not present

## 2022-02-23 DIAGNOSIS — Z419 Encounter for procedure for purposes other than remedying health state, unspecified: Secondary | ICD-10-CM | POA: Diagnosis not present

## 2022-03-26 DIAGNOSIS — Z419 Encounter for procedure for purposes other than remedying health state, unspecified: Secondary | ICD-10-CM | POA: Diagnosis not present

## 2022-04-11 ENCOUNTER — Encounter (HOSPITAL_BASED_OUTPATIENT_CLINIC_OR_DEPARTMENT_OTHER): Payer: Self-pay | Admitting: Obstetrics and Gynecology

## 2022-04-11 ENCOUNTER — Other Ambulatory Visit (HOSPITAL_BASED_OUTPATIENT_CLINIC_OR_DEPARTMENT_OTHER): Payer: Self-pay

## 2022-04-11 ENCOUNTER — Emergency Department (HOSPITAL_BASED_OUTPATIENT_CLINIC_OR_DEPARTMENT_OTHER): Payer: Medicaid Other

## 2022-04-11 ENCOUNTER — Other Ambulatory Visit: Payer: Self-pay

## 2022-04-11 ENCOUNTER — Emergency Department (HOSPITAL_BASED_OUTPATIENT_CLINIC_OR_DEPARTMENT_OTHER)
Admission: EM | Admit: 2022-04-11 | Discharge: 2022-04-11 | Disposition: A | Discharge status: Home / Self Care | Payer: Medicaid Other | Attending: Emergency Medicine | Admitting: Emergency Medicine

## 2022-04-11 DIAGNOSIS — N939 Abnormal uterine and vaginal bleeding, unspecified: Secondary | ICD-10-CM | POA: Insufficient documentation

## 2022-04-11 DIAGNOSIS — R1084 Generalized abdominal pain: Secondary | ICD-10-CM | POA: Diagnosis not present

## 2022-04-11 DIAGNOSIS — R109 Unspecified abdominal pain: Secondary | ICD-10-CM | POA: Insufficient documentation

## 2022-04-11 DIAGNOSIS — R112 Nausea with vomiting, unspecified: Secondary | ICD-10-CM | POA: Insufficient documentation

## 2022-04-11 DIAGNOSIS — R110 Nausea: Secondary | ICD-10-CM

## 2022-04-11 LAB — CBC
HCT: 41.7 % (ref 36.0–46.0)
Hemoglobin: 13.2 g/dL (ref 12.0–15.0)
MCH: 25.8 pg — ABNORMAL LOW (ref 26.0–34.0)
MCHC: 31.7 g/dL (ref 30.0–36.0)
MCV: 81.4 fL (ref 80.0–100.0)
Platelets: 343 10*3/uL (ref 150–400)
RBC: 5.12 MIL/uL — ABNORMAL HIGH (ref 3.87–5.11)
RDW: 13.2 % (ref 11.5–15.5)
WBC: 5.9 10*3/uL (ref 4.0–10.5)
nRBC: 0 % (ref 0.0–0.2)

## 2022-04-11 LAB — COMPREHENSIVE METABOLIC PANEL
ALT: 9 U/L (ref 0–44)
AST: 16 U/L (ref 15–41)
Albumin: 4.2 g/dL (ref 3.5–5.0)
Alkaline Phosphatase: 34 U/L — ABNORMAL LOW (ref 38–126)
Anion gap: 8 (ref 5–15)
BUN: 9 mg/dL (ref 6–20)
CO2: 24 mmol/L (ref 22–32)
Calcium: 9.6 mg/dL (ref 8.9–10.3)
Chloride: 105 mmol/L (ref 98–111)
Creatinine, Ser: 0.78 mg/dL (ref 0.44–1.00)
GFR, Estimated: >60 mL/min (ref >60)
Glucose, Bld: 88 mg/dL (ref 70–99)
Potassium: 4 mmol/L (ref 3.5–5.1)
Sodium: 137 mmol/L (ref 135–145)
Total Bilirubin: 0.5 mg/dL (ref 0.3–1.2)
Total Protein: 7.7 g/dL (ref 6.5–8.1)

## 2022-04-11 LAB — RAPID URINE DRUG SCREEN, HOSP PERFORMED
Amphetamines: NOT DETECTED
Barbiturates: NOT DETECTED
Benzodiazepines: NOT DETECTED
Cocaine: NOT DETECTED
Opiates: NOT DETECTED
Tetrahydrocannabinol: NOT DETECTED

## 2022-04-11 LAB — URINALYSIS, ROUTINE W REFLEX MICROSCOPIC
Bilirubin Urine: NEGATIVE
Glucose, UA: NEGATIVE mg/dL
Ketones, ur: NEGATIVE mg/dL
Leukocytes,Ua: NEGATIVE
Nitrite: NEGATIVE
Specific Gravity, Urine: 1.018 (ref 1.005–1.030)
pH: 7 (ref 5.0–8.0)

## 2022-04-11 LAB — LIPASE, BLOOD: Lipase: 15 U/L (ref 11–51)

## 2022-04-11 LAB — PREGNANCY, URINE: Preg Test, Ur: NEGATIVE

## 2022-04-11 MED ORDER — ACETAMINOPHEN 500 MG PO TABS
1000.0000 mg | ORAL_TABLET | Freq: Once | ORAL | Status: AC
Start: 2022-04-11 — End: 2022-04-11
  Administered 2022-04-11: 1000 mg via ORAL
  Filled 2022-04-11: qty 2

## 2022-04-11 MED ORDER — IOHEXOL 300 MG/ML  SOLN
100.0000 mL | Freq: Once | INTRAMUSCULAR | Status: AC | PRN
  Administered 2022-04-11: 80 mL via INTRAVENOUS

## 2022-04-11 MED ORDER — ONDANSETRON 8 MG PO TBDP
8.0000 mg | ORAL_TABLET | Freq: Three times a day (TID) | ORAL | 0 refills | Status: AC | PRN
  Filled 2022-04-11: qty 10, 4d supply, fill #0

## 2022-04-11 NOTE — ED Provider Notes (Signed)
[facility] EMERGENCY DEPT Provider Note   CSN: [id number] Arrival date & time: 04/11/22  1059     History  Chief Complaint  Patient presents with   Abdominal Pain   Nausea    Katie Wheeler is a 26 y.o. female.  Patient c/o intermittent nausea/vomiting, and mid to lower abd cramping in past couple weeks.  No bilious or bloody emesis. Symptoms acute onset, moderate, dull, non radiating, without specific exacerbating or alleviating factors. Indicates takes seasonique for birth control, and that last period was ~ 2 months ago, but is having some vaginal bleeding and cramping/pain now. Indicates bleeding similar to period bleeding. No abnormal discharge.  No hematuria or dysuria. No flank pain. No fever or chills. Is having normal bms. Reports taking pregnancy test that was negative.   The history is provided by the patient and medical records.  Abdominal Pain Associated symptoms: nausea and vaginal bleeding   Associated symptoms: no chest pain, no cough, no dysuria, no fever, no shortness of breath, no sore throat and no vaginal discharge        Home Medications Prior to Admission medications   Not on File      Allergies    Patient has no known allergies.    Review of Systems   Review of Systems  Constitutional:  Negative for fever.  HENT:  Negative for sore throat.   Eyes:  Negative for redness.  Respiratory:  Negative for cough and shortness of breath.   Cardiovascular:  Negative for chest pain.  Gastrointestinal:  Positive for abdominal pain and nausea.  Genitourinary:  Positive for vaginal bleeding. Negative for dysuria, flank pain and vaginal discharge.  Musculoskeletal:  Negative for back pain.  Skin:  Negative for rash.  Neurological:  Negative for headaches.  Hematological:  Does not bruise/bleed easily.  Psychiatric/Behavioral:  Negative for confusion.     Physical Exam Updated Vital Signs BP 128/65   Pulse 61   Temp 98 F (36.7 C)   Resp  15   Ht 1.676 m (5\' 6" )   Wt 75.8 kg   SpO2 100%   BMI 26.95 kg/m  Physical Exam Vitals and nursing note reviewed.  Constitutional:      Appearance: Normal appearance. She is well-developed.  HENT:     Head: Atraumatic.     Nose: Nose normal.     Mouth/Throat:     Mouth: Mucous membranes are moist.  Eyes:     General: No scleral icterus.    Conjunctiva/sclera: Conjunctivae normal.  Neck:     Trachea: No tracheal deviation.  Cardiovascular:     Rate and Rhythm: Normal rate and regular rhythm.     Pulses: Normal pulses.     Heart sounds: Normal heart sounds. No murmur heard.    No friction rub. No gallop.  Pulmonary:     Effort: Pulmonary effort is normal. No respiratory distress.     Breath sounds: Normal breath sounds.  Abdominal:     General: Bowel sounds are normal. There is no distension.     Palpations: Abdomen is soft. There is no mass.     Tenderness: There is abdominal tenderness. There is no guarding or rebound.     Hernia: No hernia is present.     Comments: Mid abd tenderness.   Genitourinary:    Comments: No cva tenderness.  Musculoskeletal:        General: No swelling.     Cervical back: Neck supple. No muscular tenderness.  Skin:  General: Skin is warm and dry.     Findings: No rash.  Neurological:     Mental Status: She is alert.     Comments: Alert, speech normal.   Psychiatric:        Mood and Affect: Mood normal.     ED Results / Procedures / Treatments   Labs (all labs ordered are listed, but only abnormal results are displayed) Results for orders placed or performed during the hospital encounter of 04/11/22  Lipase, blood  Result Value Ref Range   Lipase 15 11 - 51 U/L  Comprehensive metabolic panel  Result Value Ref Range   Sodium 137 135 - 145 mmol/L   Potassium 4.0 3.5 - 5.1 mmol/L   Chloride 105 98 - 111 mmol/L   CO2 24 22 - 32 mmol/L   Glucose, Bld 88 70 - 99 mg/dL   BUN 9 6 - 20 mg/dL   Creatinine, Ser 0.35 0.44 - 1.00 mg/dL    Calcium 9.6 8.9 - 00.9 mg/dL   Total Protein 7.7 6.5 - 8.1 g/dL   Albumin 4.2 3.5 - 5.0 g/dL   AST 16 15 - 41 U/L   ALT 9 0 - 44 U/L   Alkaline Phosphatase 34 (L) 38 - 126 U/L   Total Bilirubin 0.5 0.3 - 1.2 mg/dL   GFR, Estimated >38 >18 mL/min   Anion gap 8 5 - 15  CBC  Result Value Ref Range   WBC 5.9 4.0 - 10.5 K/uL   RBC 5.12 (H) 3.87 - 5.11 MIL/uL   Hemoglobin 13.2 12.0 - 15.0 g/dL   HCT 29.9 37.1 - 69.6 %   MCV 81.4 80.0 - 100.0 fL   MCH 25.8 (L) 26.0 - 34.0 pg   MCHC 31.7 30.0 - 36.0 g/dL   RDW 78.9 38.1 - 01.7 %   Platelets 343 150 - 400 K/uL   nRBC 0.0 0.0 - 0.2 %  Urinalysis, Routine w reflex microscopic Urine, Clean Catch  Result Value Ref Range   Color, Urine YELLOW YELLOW   APPearance CLEAR CLEAR   Specific Gravity, Urine 1.018 1.005 - 1.030   pH 7.0 5.0 - 8.0   Glucose, UA NEGATIVE NEGATIVE mg/dL   Hgb urine dipstick LARGE (A) NEGATIVE   Bilirubin Urine NEGATIVE NEGATIVE   Ketones, ur NEGATIVE NEGATIVE mg/dL   Protein, ur TRACE (A) NEGATIVE mg/dL   Nitrite NEGATIVE NEGATIVE   Leukocytes,Ua NEGATIVE NEGATIVE   RBC / HPF 21-50 0 - 5 RBC/hpf   WBC, UA 0-5 0 - 5 WBC/hpf   Squamous Epithelial / LPF 0-5 0 - 5  Rapid urine drug screen (hospital performed)  Result Value Ref Range   Opiates NONE DETECTED NONE DETECTED   Cocaine NONE DETECTED NONE DETECTED   Benzodiazepines NONE DETECTED NONE DETECTED   Amphetamines NONE DETECTED NONE DETECTED   Tetrahydrocannabinol NONE DETECTED NONE DETECTED   Barbiturates NONE DETECTED NONE DETECTED  Pregnancy, urine  Result Value Ref Range   Preg Test, Ur NEGATIVE NEGATIVE   CT Abdomen Pelvis W Contrast  Result Date: 04/11/2022 CLINICAL DATA:  Abdominal pain, nausea, vomiting EXAM: CT ABDOMEN AND PELVIS WITH CONTRAST TECHNIQUE: Multidetector CT imaging of the abdomen and pelvis was performed using the standard protocol following bolus administration of intravenous contrast. RADIATION DOSE REDUCTION: This exam was  performed according to the departmental dose-optimization program which includes automated exposure control, adjustment of the mA and/or kV according to patient size and/or use of iterative reconstruction technique. CONTRAST:  16mL  OMNIPAQUE IOHEXOL 300 MG/ML  SOLN COMPARISON:  None Available. FINDINGS: Lower chest: Unremarkable. Hepatobiliary: No in image 24 of series 2, there is 3 mm low-density in the right lobe, possibly tiny cyst or hemangioma. There is no dilation of bile ducts. Gallbladder is unremarkable. Pancreas: No focal abnormalities are seen. Spleen: Unremarkable. Adrenals/Urinary Tract: Adrenals are unremarkable. There is no hydronephrosis. There are no renal or ureteral stones. Urinary bladder is unremarkable. Stomach/Bowel: Stomach is unremarkable. Small bowel loops are not dilated. Appendix is not dilated. There is no significant wall thickening in the colon. There is no pericolic stranding. Vascular/Lymphatic: Unremarkable. Reproductive: Unremarkable. Other: There is no ascites or pneumoperitoneum umbilical hernia containing fat is seen. Musculoskeletal: Unremarkable. IMPRESSION: There is no evidence of intestinal obstruction or pneumoperitoneum. Appendix is not dilated. There is no hydronephrosis. Possible tiny 3 mm cyst in the liver. Umbilical hernia containing fat. Electronically Signed   By: Ernie Avena M.D.   On: 04/11/2022 13:56     EKG None  Radiology CT Abdomen Pelvis W Contrast  Result Date: 04/11/2022 CLINICAL DATA:  Abdominal pain, nausea, vomiting EXAM: CT ABDOMEN AND PELVIS WITH CONTRAST TECHNIQUE: Multidetector CT imaging of the abdomen and pelvis was performed using the standard protocol following bolus administration of intravenous contrast. RADIATION DOSE REDUCTION: This exam was performed according to the departmental dose-optimization program which includes automated exposure control, adjustment of the mA and/or kV according to patient size and/or use of  iterative reconstruction technique. CONTRAST:  32mL OMNIPAQUE IOHEXOL 300 MG/ML  SOLN COMPARISON:  None Available. FINDINGS: Lower chest: Unremarkable. Hepatobiliary: No in image 24 of series 2, there is 3 mm low-density in the right lobe, possibly tiny cyst or hemangioma. There is no dilation of bile ducts. Gallbladder is unremarkable. Pancreas: No focal abnormalities are seen. Spleen: Unremarkable. Adrenals/Urinary Tract: Adrenals are unremarkable. There is no hydronephrosis. There are no renal or ureteral stones. Urinary bladder is unremarkable. Stomach/Bowel: Stomach is unremarkable. Small bowel loops are not dilated. Appendix is not dilated. There is no significant wall thickening in the colon. There is no pericolic stranding. Vascular/Lymphatic: Unremarkable. Reproductive: Unremarkable. Other: There is no ascites or pneumoperitoneum umbilical hernia containing fat is seen. Musculoskeletal: Unremarkable. IMPRESSION: There is no evidence of intestinal obstruction or pneumoperitoneum. Appendix is not dilated. There is no hydronephrosis. Possible tiny 3 mm cyst in the liver. Umbilical hernia containing fat. Electronically Signed   By: Ernie Avena M.D.   On: 04/11/2022 13:56    Procedures Procedures    Medications Ordered in ED Medications  iohexol (OMNIPAQUE) 300 MG/ML solution 100 mL (80 mLs Intravenous Contrast Given 04/11/22 1326)    ED Course/ Medical Decision Making/ A&P                           Medical Decision Making Problems Addressed: Abdominal pain, generalized: acute illness or injury with systemic symptoms that poses a threat to life or bodily functions Nausea: acute illness or injury Vagina bleeding: acute illness or injury  Amount and/or Complexity of Data Reviewed External Data Reviewed: notes. Labs: ordered. Decision-making details documented in ED Course. Radiology: ordered and independent interpretation performed. Decision-making details documented in ED  Course.  Risk Prescription drug management. Decision regarding hospitalization.   Iv ns. Continuous pulse ox and cardiac monitoring. Labs ordered/sent. Imaging ordered.   Diff dx includes menstrual bleeding/cramping, appendicitis, gastroenteritis, dehydration, pregnancy, uti, etc - dispo decision including potential need for admission if significant lab abn/ct abnormality considered -  will get labs and imaging and reassess.   Reviewed nursing notes and prior charts for additional history. External reports reviewed.   Cardiac monitor: sinus rhythm, rate 64.  Labs reviewed/interpreted by me - wbc and hgb normal. Preg negative.   CT reviewed/interpreted by me - no appendicitis.   Acetaminophen po, zofran po. Po fluids.   Recheck pt comfortable. No nausea/vomiting. Abd soft non tender.   Pt currently appears stable for d/c.   Pt indicates can f/u w her doctor.  Return precautions provided.            Final Clinical Impression(s) / ED Diagnoses Final diagnoses:  None    Rx / DC Orders ED Discharge Orders     None         Cathren Laine, MD 04/11/22 1441

## 2022-04-11 NOTE — ED Notes (Signed)
IV removed from pt's right AC, per Robin - Medic. IV site was: clean, dry, and intact.

## 2022-04-11 NOTE — Discharge Instructions (Addendum)
It was our pleasure to provide your ER care today - we hope that you feel better.  Drink plenty of fluids/stay well hydrated.   Take acetaminophen or ibuprofen as need.   Follow up with your doctor in the next 1-2 weeks for recheck.  Return to ER if worse, new symptoms, new, worsening or severe pain, persistent vomiting, fevers, or other concern.

## 2022-04-11 NOTE — ED Triage Notes (Signed)
Patient reports to the ER for nausea and emesis. Patient reports she has been having nausea constantly since August 6th. Patient reports negative pregnancy with her PCP.

## 2022-04-26 DIAGNOSIS — Z419 Encounter for procedure for purposes other than remedying health state, unspecified: Secondary | ICD-10-CM | POA: Diagnosis not present

## 2022-05-26 DIAGNOSIS — Z419 Encounter for procedure for purposes other than remedying health state, unspecified: Secondary | ICD-10-CM | POA: Diagnosis not present

## 2022-06-26 DIAGNOSIS — Z419 Encounter for procedure for purposes other than remedying health state, unspecified: Secondary | ICD-10-CM | POA: Diagnosis not present

## 2022-12-29 ENCOUNTER — Other Ambulatory Visit: Payer: Self-pay

## 2022-12-29 ENCOUNTER — Emergency Department (HOSPITAL_BASED_OUTPATIENT_CLINIC_OR_DEPARTMENT_OTHER)
Admission: EM | Admit: 2022-12-29 | Discharge: 2022-12-29 | Disposition: A | Discharge status: Home / Self Care | Payer: BC Managed Care – PPO | Attending: Emergency Medicine | Admitting: Emergency Medicine

## 2022-12-29 ENCOUNTER — Encounter (HOSPITAL_BASED_OUTPATIENT_CLINIC_OR_DEPARTMENT_OTHER): Payer: Self-pay | Admitting: Emergency Medicine

## 2022-12-29 DIAGNOSIS — W4904XA Ring or other jewelry causing external constriction, initial encounter: Secondary | ICD-10-CM | POA: Diagnosis not present

## 2022-12-29 DIAGNOSIS — S60444A External constriction of right ring finger, initial encounter: Secondary | ICD-10-CM | POA: Insufficient documentation

## 2022-12-29 DIAGNOSIS — S60449A External constriction of unspecified finger, initial encounter: Secondary | ICD-10-CM

## 2022-12-29 NOTE — ED Triage Notes (Signed)
Right ring finger has ring that is swollen. Awoke with finger swollen.

## 2022-12-29 NOTE — ED Provider Notes (Signed)
[place] EMERGENCY DEPARTMENT AT [facility] Provider Note   CSN: [id number] Arrival date & time: 12/29/22  1218     History  No chief complaint on file.   Katie Wheeler is a 27 y.o. female.  27 year old female who presents with swollen finger.  Patient reports that last night she put a ring on her right ring finger.  She went out and woke up in the morning and noticed that her right ring finger was swollen and was unable to get her ring off.  Has tried grease and oil and several other treatments.  Requesting that it be cut off.  Denies any significant pain at this time.       Home Medications Prior to Admission medications   Medication Sig Start Date End Date Taking? Authorizing Provider  ondansetron (ZOFRAN-ODT) 8 MG disintegrating tablet Take 1 tablet (8 mg total) by mouth every 8 (eight) hours as needed for nausea or vomiting. 04/11/22   Cathren Laine, MD      Allergies    Patient has no known allergies.    Review of Systems   Review of Systems  Physical Exam Updated Vital Signs There were no vitals taken for this visit. Physical Exam Constitutional:      Appearance: Normal appearance.  HENT:     Head: Normocephalic and atraumatic.  Musculoskeletal:     Comments: Mild amount of swelling of the right ring finger.  Ring unable to be removed so ring cutters were used and it was removed.  Full motor function and intact sensation of the finger.  It appears warm and well-perfused with capillary refill less than 3 seconds.  Neurological:     Mental Status: She is alert.     ED Results / Procedures / Treatments   Labs (all labs ordered are listed, but only abnormal results are displayed) Labs Reviewed - No data to display  EKG None  Radiology No results found.  Procedures Procedures   Medications Ordered in ED Medications - No data to display  ED Course/ Medical Decision Making/ A&P                             Medical Decision  Making  Katie Wheeler is a 27 y.o. female who presents with swollen finger and ring that is unable to be removed  Initial Ddx:  Compartment syndrome, ischemic digit, tourniquet syndrome  MDM:  Does not appear that patient has tourniquet syndrome or compartment syndrome of her hand at this time.  Finger does not appear ischemic.  Unable to move ring herself so we will perform this time.  Plan:  Ring removal  ED Summary/Re-evaluation:  Ring removed successfully. Patient instructed to follow-up with her primary doctor in 2 to 3 days to ensure that her finger swelling improved by then.  This patient presents to the ED for concern of complaints listed in HPI, this involves an extensive number of treatment options, and is a complaint that carries with it a high risk of complications and morbidity. Disposition including potential need for admission considered.   Dispo: DC Home. Return precautions discussed including, but not limited to, those listed in the AVS. Allowed pt time to ask questions which were answered fully prior to dc.  Records reviewed Outpatient Clinic Notes  Final Clinical Impression(s) / ED Diagnoses Final diagnoses:  Tight ring on finger    Rx / DC Orders ED Discharge Orders     None  Rondel Baton, MD 12/29/22 1351

## 2022-12-29 NOTE — Discharge Instructions (Signed)
Follow-up with your primary doctor in 2 to 3 days to ensure that the swelling has resolved

## 2023-08-25 ENCOUNTER — Other Ambulatory Visit (HOSPITAL_COMMUNITY): Payer: Self-pay
